# Patient Record
Sex: Male | Born: 1975 | Race: White | Hispanic: No | Marital: Single | Smoking: Current every day smoker
Health system: Southern US, Community
[De-identification: ages and names within clinical notes are randomized; demographics above are authoritative.]

---

## 2001-06-02 ENCOUNTER — Emergency Department (HOSPITAL_COMMUNITY): Admission: EM | Admit: 2001-06-02 | Discharge: 2001-06-02 | Payer: Self-pay | Admitting: Emergency Medicine

## 2002-09-02 ENCOUNTER — Emergency Department (HOSPITAL_COMMUNITY): Admission: EM | Admit: 2002-09-02 | Discharge: 2002-09-02 | Payer: Self-pay | Admitting: Emergency Medicine

## 2002-09-02 ENCOUNTER — Encounter: Payer: Self-pay | Admitting: Emergency Medicine

## 2004-04-20 ENCOUNTER — Emergency Department (HOSPITAL_COMMUNITY): Admission: EM | Admit: 2004-04-20 | Discharge: 2004-04-20 | Payer: Self-pay | Admitting: Emergency Medicine

## 2004-05-31 ENCOUNTER — Emergency Department (HOSPITAL_COMMUNITY): Admission: EM | Admit: 2004-05-31 | Discharge: 2004-05-31 | Payer: Self-pay | Admitting: Emergency Medicine

## 2004-06-18 ENCOUNTER — Emergency Department (HOSPITAL_COMMUNITY): Admission: EM | Admit: 2004-06-18 | Discharge: 2004-06-18 | Payer: Self-pay | Admitting: Emergency Medicine

## 2004-07-16 ENCOUNTER — Emergency Department (HOSPITAL_COMMUNITY): Admission: EM | Admit: 2004-07-16 | Discharge: 2004-07-16 | Payer: Self-pay | Admitting: Emergency Medicine

## 2004-10-24 ENCOUNTER — Ambulatory Visit: Payer: Self-pay | Admitting: Internal Medicine

## 2005-01-30 ENCOUNTER — Emergency Department (HOSPITAL_COMMUNITY): Admission: EM | Admit: 2005-01-30 | Discharge: 2005-01-30 | Payer: Self-pay | Admitting: Emergency Medicine

## 2005-06-18 ENCOUNTER — Emergency Department (HOSPITAL_COMMUNITY): Admission: EM | Admit: 2005-06-18 | Discharge: 2005-06-18 | Payer: Self-pay | Admitting: Emergency Medicine

## 2005-08-07 ENCOUNTER — Inpatient Hospital Stay (HOSPITAL_COMMUNITY): Admission: RE | Admit: 2005-08-07 | Discharge: 2005-08-13 | Payer: Self-pay | Admitting: Psychiatry

## 2005-08-07 ENCOUNTER — Ambulatory Visit: Payer: Self-pay | Admitting: Psychiatry

## 2018-01-09 NOTE — Congregational Nurse Program (Signed)
Congregational Nurse Program Note  Date of Encounter: 12/18/2017  Past Medical History: No past medical history on file.  Encounter Details: CNP Questionnaire - 12/18/17 1529      Questionnaire   Patient Status  Not Applicable    Race  White or Caucasian    Location Patient S[facility]  [facility]    Insurance  Not Applicable    Uninsured  Uninsured (NEW 1x/quarter)    Food  Yes, have food insecurities;Within past 12 months, worried food would run out with no money to buy more    Housing/Utilities  Yes, have permanent housing    Transportation  Yes, need transportation assistance    Interpersonal Safety  No, do not feel physically and emotionally safe where you currently live    Medication  Yes, have medication insecurities    Medical Provider  No    Referrals  Behavioral/Mental Health Provider    ED Visit Averted  Not Applicable    Life-Saving Intervention Made  Not Applicable     [facility].  Requested to see the nurse.  States is having difficulty with paranoia and fearfulness.  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  Believes the symptoms are coming from the Meth use.  States when he is not using, his mind is clear.  At the time of this visit, no evidence of hallucinations or delusions.  Affect appropriate.  Well oriented.  Discussed with client treatment options.  Encouraged him to process the gains in continuing his use versus treatment.  Client states he does not want to quit using Meth.  However he did state, he believes he needs to.

## 2018-01-09 NOTE — Congregational Nurse Program (Signed)
Congregational Nurse Program Note  Date of Encounter: 12/25/2017  Past Medical History: No past medical history on file.  Encounter Details: CNP Questionnaire - 12/25/17 1535      Questionnaire   Patient Status  Not Applicable    Race  White or Caucasian    Location Patient Served At  [facility]  Not Applicable    Uninsured  Uninsured (Subsequent visits/quarter)    Food  Yes, have food insecurities;Within past 12 months, worried food would run out with no money to buy more    Housing/Utilities  Yes, have permanent housing    Transportation  Yes, need transportation assistance    Interpersonal Safety  No, do not feel physically and emotionally safe where you currently live    Medication  Yes, have medication insecurities    Medical Provider  No    Referrals  Behavioral/Mental Health Provider    ED Visit Averted  Not Applicable    Life-Saving Intervention Made  Not Applicable      [facility].  Met with social work Theatre manager to discuss couple's therapy as his Meth use is impacting his relationship.

## 2018-06-24 ENCOUNTER — Emergency Department (HOSPITAL_COMMUNITY)
Admission: EM | Admit: 2018-06-24 | Discharge: 2018-06-24 | Payer: Self-pay | Attending: Emergency Medicine | Admitting: Emergency Medicine

## 2018-06-24 ENCOUNTER — Encounter (HOSPITAL_COMMUNITY): Payer: Self-pay

## 2018-06-24 DIAGNOSIS — L0390 Cellulitis, unspecified: Secondary | ICD-10-CM

## 2018-06-24 DIAGNOSIS — F1910 Other psychoactive substance abuse, uncomplicated: Secondary | ICD-10-CM | POA: Insufficient documentation

## 2018-06-24 DIAGNOSIS — W57XXXA Bitten or stung by nonvenomous insect and other nonvenomous arthropods, initial encounter: Secondary | ICD-10-CM | POA: Insufficient documentation

## 2018-06-24 DIAGNOSIS — F17210 Nicotine dependence, cigarettes, uncomplicated: Secondary | ICD-10-CM | POA: Insufficient documentation

## 2018-06-24 DIAGNOSIS — L03113 Cellulitis of right upper limb: Secondary | ICD-10-CM | POA: Insufficient documentation

## 2018-06-24 MED ORDER — CLINDAMYCIN HCL 300 MG PO CAPS
300.0000 mg | ORAL_CAPSULE | Freq: Once | ORAL | Status: AC
  Administered 2018-06-24: 300 mg via ORAL
  Filled 2018-06-24: qty 1

## 2018-06-24 MED ORDER — CLINDAMYCIN HCL 300 MG PO CAPS: 300.0000 mg | ORAL_CAPSULE | Freq: Four times a day (QID) | ORAL | 0 refills | Status: DC

## 2018-06-24 MED ORDER — ACETAMINOPHEN 500 MG PO TABS
1000.0000 mg | ORAL_TABLET | Freq: Once | ORAL | Status: AC
Start: 2018-06-24 — End: 2018-06-24
  Administered 2018-06-24: 1000 mg via ORAL
  Filled 2018-06-24: qty 2
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
## 2018-06-24 NOTE — ED Notes (Signed)
Pt is clear for discharge when he wakes up and eats his sandwich, [employer] are aware

## 2018-06-24 NOTE — Discharge Instructions (Signed)
Clindamycin as prescribed.  Return to the emergency department for high fever, worsening redness, or other new and concerning symptoms.
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
## 2018-06-24 NOTE — ED Notes (Signed)
Pt given a sandwich and a drink
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
## 2019-02-06 ENCOUNTER — Emergency Department (HOSPITAL_COMMUNITY)
Admission: EM | Admit: 2019-02-06 | Discharge: 2019-02-06 | Disposition: A | Discharge status: Home / Self Care | Payer: Self-pay | Attending: Emergency Medicine | Admitting: Emergency Medicine

## 2019-02-06 ENCOUNTER — Encounter (HOSPITAL_COMMUNITY): Payer: Self-pay | Admitting: Emergency Medicine

## 2019-02-06 DIAGNOSIS — W2209XA Striking against other stationary object, initial encounter: Secondary | ICD-10-CM | POA: Insufficient documentation

## 2019-02-06 DIAGNOSIS — W109XXA Fall (on) (from) unspecified stairs and steps, initial encounter: Secondary | ICD-10-CM | POA: Insufficient documentation

## 2019-02-06 DIAGNOSIS — Y92009 Unspecified place in unspecified non-institutional (private) residence as the place of occurrence of the external cause: Secondary | ICD-10-CM | POA: Insufficient documentation

## 2019-02-06 DIAGNOSIS — S51811A Laceration without foreign body of right forearm, initial encounter: Secondary | ICD-10-CM | POA: Insufficient documentation

## 2019-02-06 DIAGNOSIS — F17210 Nicotine dependence, cigarettes, uncomplicated: Secondary | ICD-10-CM | POA: Insufficient documentation

## 2019-02-06 DIAGNOSIS — Y9389 Activity, other specified: Secondary | ICD-10-CM | POA: Insufficient documentation

## 2019-02-06 DIAGNOSIS — Y999 Unspecified external cause status: Secondary | ICD-10-CM | POA: Insufficient documentation

## 2019-02-06 MED ORDER — LIDOCAINE-EPINEPHRINE (PF) 2 %-1:200000 IJ SOLN
INTRAMUSCULAR | Status: AC
  Filled 2019-02-06: qty 20
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
## 2019-02-06 NOTE — ED Provider Notes (Signed)
[name] EMERGENCY DEPARTMENT Provider Note   CSN: [id number] Arrival date & time: 02/06/19  0306    History   Chief Complaint Chief Complaint  Patient presents with  . Extremity Laceration    HPI [facility] is a 43 y.o. male.     HPI   43yo male presents with right forearm laceration. Reports he was trying to catch himself from fallig and a nail caught and caused laceration to right forearm. Significant bleeding at the scene. Bleeding controlled with dressing.  Has fingers in flexion, reports difficult moving/extending fingers.  UTD tetanus, has had in last 3 years. No numbness. No other injuries. Reports severe pain.   History reviewed. No pertinent past medical history.  There are no active problems to display for this patient.   History reviewed. No pertinent surgical history.      Home Medications    Prior to Admission medications   Medication Sig Start Date End Date Taking? Authorizing Provider  clindamycin (CLEOCIN) 300 MG capsule Take 1 capsule (300 mg total) by mouth 4 (four) times daily. X 10 days 06/24/18   Geoffery Lyons, MD    Family History No family history on file.  Social History Social History   Tobacco Use  . Smoking status: Current Every Day Smoker  . Smokeless tobacco: Never Used  Substance Use Topics  . Alcohol use: Never    Frequency: Never  . Drug use: Yes     Allergies   Patient has no allergy information on record.   Review of Systems Review of Systems  Musculoskeletal: Positive for myalgias.  Skin: Positive for wound.  Neurological: Negative for light-headedness and numbness.     Physical Exam Updated Vital Signs BP 120/68   Pulse 77   Ht 5\' 6"  (1.676 m)   Wt 79.4 kg   SpO2 99%   BMI 28.25 kg/m   Physical Exam Vitals signs and nursing note reviewed.  Constitutional:      General: He is not in acute distress.    Appearance: He is well-developed. He is not diaphoretic.  HENT:     Head:  Normocephalic and atraumatic.  Eyes:     Conjunctiva/sclera: Conjunctivae normal.  Neck:     Musculoskeletal: Normal range of motion.  Cardiovascular:     Rate and Rhythm: Normal rate and regular rhythm.  Pulmonary:     Effort: Pulmonary effort is normal. No respiratory distress.  Musculoskeletal:     Comments: Initial exam: Normal sensation, normal cap refill, 2+ pulses, holding fingers in flexion stating he cannot extend them, holding them with good strength, able to extend Repeat exam: normal full flexion and extension of fingers  Skin:    General: Skin is warm and dry.     Findings: Laceration present.     Comments: 5cm laceration with exposure of fascia/muscle, monitored through finger movements without signs of muscular or tendon injury. Venous bleeding from ulnar side  Neurological:     Mental Status: He is alert and oriented to person, place, and time.      ED Treatments / Results  Labs (all labs ordered are listed, but only abnormal results are displayed) Labs Reviewed - No data to display  EKG None  Radiology No results found.  Procedures .Marland KitchenLaceration Repair Date/Time: 02/06/2019 5:30 AM Performed by: Alvira Monday, MD Authorized by: Alvira Monday, MD   Consent:    Consent obtained:  Verbal   Consent given by:  Patient   Risks discussed:  Infection,  pain, need for additional repair, poor cosmetic result and poor wound healing Anesthesia (see MAR for exact dosages):    Anesthesia method:  Local infiltration   Local anesthetic:  Lidocaine 1% WITH epi Laceration details:    Location:  Shoulder/arm   Shoulder/arm location:  R lower arm   Length (cm):  5   Depth (mm):  7.5 Repair type:    Repair type:  Complex Pre-procedure details:    Preparation:  Patient was prepped and draped in usual sterile fashion Exploration:    Hemostasis achieved with:  Direct pressure   Wound exploration: wound explored through full range of motion and entire depth of wound  probed and visualized     Wound extent: no muscle damage noted, no nerve damage noted and no tendon damage noted   Treatment:    Area cleansed with:  Saline   Amount of cleaning:  Standard   Irrigation solution:  Sterile saline   Irrigation volume:  450   Irrigation method:  Pressure wash   Visualized foreign bodies/material removed: no   Subcutaneous repair:    Suture size:  3-0   Suture material:  Vicryl   Suture technique:  Simple interrupted   Number of sutures:  4 Skin repair:    Repair method:  Sutures   Suture size:  3-0   Suture material:  Prolene   Suture technique:  Simple interrupted   Number of sutures:  6 Approximation:    Approximation:  Close Post-procedure details:    Dressing:  Antibiotic ointment and non-adherent dressing   Patient tolerance of procedure:  Tolerated well, no immediate complications   (including critical care time)  Medications Ordered in ED Medications  lidocaine-EPINEPHrine (XYLOCAINE W/EPI) 2 %-1:200000 (PF) injection (has no administration in time range)     Initial Impression / Assessment and Plan / ED Course  I have reviewed the triage vital signs and the nursing notes.  Pertinent labs & imaging results that were available during my care of the patient were reviewed by me and considered in my medical decision making (see chart for details).        43yo RHD male presents with concern fro right forearm laceration after accidentally cutting self on a nail.  No hx to suggest other injuries or fracture. Wound explored thoroughly with no sign of foreign body.  Venous bleeding controlled with pressure and licocaine/epi.  Initially reporting difficulty extending fingers, although the laceration is located on volar forearm and not consistent with extensor tendon injury. No sign of flexor tendon injury on history or exam.  Initial difficulty extending fingers likely secondary to pain. After local anesthesia given, demonstrates ful ROM of  fingers.    Recommend suture removal in 7-10 days, discussed reasons to return. Patient discharged in stable condition with understanding of reasons to return.   Final Clinical Impressions(s) / ED Diagnoses   Final diagnoses:  Laceration of right forearm, initial encounter    ED Discharge Orders    None       Alvira Monday, MD 02/06/19 2257

## 2020-03-26 ENCOUNTER — Emergency Department (HOSPITAL_COMMUNITY): Payer: Self-pay

## 2020-03-26 ENCOUNTER — Other Ambulatory Visit: Payer: Self-pay

## 2020-03-26 ENCOUNTER — Encounter (HOSPITAL_COMMUNITY): Payer: Self-pay | Admitting: Emergency Medicine

## 2020-03-26 ENCOUNTER — Emergency Department (HOSPITAL_COMMUNITY)
Admission: EM | Admit: 2020-03-26 | Discharge: 2020-03-26 | Disposition: A | Discharge status: Home / Self Care | Payer: Self-pay | Attending: Emergency Medicine | Admitting: Emergency Medicine

## 2020-03-26 DIAGNOSIS — T148XXA Other injury of unspecified body region, initial encounter: Secondary | ICD-10-CM

## 2020-03-26 DIAGNOSIS — F17210 Nicotine dependence, cigarettes, uncomplicated: Secondary | ICD-10-CM | POA: Insufficient documentation

## 2020-03-26 DIAGNOSIS — Y929 Unspecified place or not applicable: Secondary | ICD-10-CM | POA: Insufficient documentation

## 2020-03-26 DIAGNOSIS — L03115 Cellulitis of right lower limb: Secondary | ICD-10-CM | POA: Insufficient documentation

## 2020-03-26 DIAGNOSIS — S21232A Puncture wound without foreign body of left back wall of thorax without penetration into thoracic cavity, initial encounter: Secondary | ICD-10-CM | POA: Insufficient documentation

## 2020-03-26 DIAGNOSIS — Y9389 Activity, other specified: Secondary | ICD-10-CM | POA: Insufficient documentation

## 2020-03-26 DIAGNOSIS — Y999 Unspecified external cause status: Secondary | ICD-10-CM | POA: Insufficient documentation

## 2020-03-26 LAB — COMPREHENSIVE METABOLIC PANEL
ALT: 43 U/L (ref 0–44)
AST: 63 U/L — ABNORMAL HIGH (ref 15–41)
Albumin: 4.4 g/dL (ref 3.5–5.0)
Alkaline Phosphatase: 69 U/L (ref 38–126)
Anion gap: 9 (ref 5–15)
BUN: 10 mg/dL (ref 6–20)
CO2: 23 mmol/L (ref 22–32)
Calcium: 9.6 mg/dL (ref 8.9–10.3)
Chloride: 109 mmol/L (ref 98–111)
Creatinine, Ser: 0.98 mg/dL (ref 0.61–1.24)
GFR calc Af Amer: >60 mL/min (ref >60)
GFR calc non Af Amer: >60 mL/min (ref >60)
Glucose, Bld: 101 mg/dL — ABNORMAL HIGH (ref 70–99)
Potassium: 3.6 mmol/L (ref 3.5–5.1)
Sodium: 141 mmol/L (ref 135–145)
Total Bilirubin: 1 mg/dL (ref 0.3–1.2)
Total Protein: 7.2 g/dL (ref 6.5–8.1)

## 2020-03-26 LAB — CBC
HCT: 40.2 % (ref 39.0–52.0)
Hemoglobin: 13.6 g/dL (ref 13.0–17.0)
MCH: 31 pg (ref 26.0–34.0)
MCHC: 33.8 g/dL (ref 30.0–36.0)
MCV: 91.6 fL (ref 80.0–100.0)
Platelets: 276 10*3/uL (ref 150–400)
RBC: 4.39 MIL/uL (ref 4.22–5.81)
RDW: 13.2 % (ref 11.5–15.5)
WBC: 9.1 10*3/uL (ref 4.0–10.5)
nRBC: 0 % (ref 0.0–0.2)

## 2020-03-26 LAB — SAMPLE TO BLOOD BANK

## 2020-03-26 LAB — I-STAT CHEM 8, ED
BUN: 11 mg/dL (ref 6–20)
Calcium, Ion: 1.2 mmol/L (ref 1.15–1.40)
Chloride: 106 mmol/L (ref 98–111)
Creatinine, Ser: 0.8 mg/dL (ref 0.61–1.24)
Glucose, Bld: 96 mg/dL (ref 70–99)
HCT: 40 % (ref 39.0–52.0)
Hemoglobin: 13.6 g/dL (ref 13.0–17.0)
Potassium: 3.5 mmol/L (ref 3.5–5.1)
Sodium: 143 mmol/L (ref 135–145)
TCO2: 24 mmol/L (ref 22–32)

## 2020-03-26 LAB — LACTIC ACID, PLASMA: Lactic Acid, Venous: 1.6 mmol/L (ref 0.5–1.9)

## 2020-03-26 LAB — ETHANOL: Alcohol, Ethyl (B): <10 mg/dL (ref <10)

## 2020-03-26 LAB — PROTIME-INR
INR: 1.1 (ref 0.8–1.2)
Prothrombin Time: 13.7 seconds (ref 11.4–15.2)

## 2020-03-26 MED ORDER — MORPHINE SULFATE (PF) 4 MG/ML IV SOLN: 4.0000 mg | Freq: Once | INTRAVENOUS | Status: DC

## 2020-03-26 MED ORDER — ONDANSETRON HCL 4 MG/2ML IJ SOLN: 4.0000 mg | Freq: Once | INTRAMUSCULAR | Status: DC

## 2020-03-26 MED ORDER — IOHEXOL 300 MG/ML  SOLN
100.0000 mL | Freq: Once | INTRAMUSCULAR | Status: AC | PRN
  Administered 2020-03-26: 16:00:00 100 mL via INTRAVENOUS
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
## 2020-03-26 NOTE — Discharge Instructions (Signed)
Please clean this out as best you can in the shower.
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
## 2020-03-26 NOTE — ED Notes (Signed)
Pt talking to self saying "I'm not a drug addict and I don't appreciate people treating me like one.  I didn't ask for pain meds".

## 2020-04-28 ENCOUNTER — Encounter (HOSPITAL_COMMUNITY): Payer: Self-pay

## 2020-04-28 ENCOUNTER — Emergency Department (HOSPITAL_COMMUNITY)
Admission: EM | Admit: 2020-04-28 | Discharge: 2020-04-28 | Disposition: A | Discharge status: Home / Self Care | Payer: Self-pay | Attending: Emergency Medicine | Admitting: Emergency Medicine

## 2020-04-28 ENCOUNTER — Emergency Department (HOSPITAL_COMMUNITY): Payer: Self-pay

## 2020-04-28 DIAGNOSIS — Y939 Activity, unspecified: Secondary | ICD-10-CM | POA: Insufficient documentation

## 2020-04-28 DIAGNOSIS — F17200 Nicotine dependence, unspecified, uncomplicated: Secondary | ICD-10-CM | POA: Insufficient documentation

## 2020-04-28 DIAGNOSIS — R609 Edema, unspecified: Secondary | ICD-10-CM

## 2020-04-28 DIAGNOSIS — S62611A Displaced fracture of proximal phalanx of left index finger, initial encounter for closed fracture: Secondary | ICD-10-CM | POA: Insufficient documentation

## 2020-04-28 DIAGNOSIS — W500XXA Accidental hit or strike by another person, initial encounter: Secondary | ICD-10-CM | POA: Insufficient documentation

## 2020-04-28 DIAGNOSIS — Y929 Unspecified place or not applicable: Secondary | ICD-10-CM | POA: Insufficient documentation

## 2020-04-28 DIAGNOSIS — Y999 Unspecified external cause status: Secondary | ICD-10-CM | POA: Insufficient documentation

## 2020-04-28 MED ORDER — OXYCODONE-ACETAMINOPHEN 5-325 MG PO TABS
1.0000 | ORAL_TABLET | Freq: Once | ORAL | Status: AC
  Administered 2020-04-28: 1 via ORAL
  Filled 2020-04-28: qty 1

## 2020-04-28 MED ORDER — HYDROCODONE-ACETAMINOPHEN 5-325 MG PO TABS: 1.0000 | ORAL_TABLET | Freq: Four times a day (QID) | ORAL | 0 refills | Status: DC | PRN
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
## 2020-04-28 NOTE — Discharge Instructions (Addendum)
Please read and follow all provided instructions.  You have been seen today after an injury to your left hand Your xray shows a fracture of your second finger We have placed you in a splint- please keep this clean & dry and intact until you have followed up with orthopedics. Please call orthopedic surgery tomorrow to schedule an appointment within the next 3 days.   Home care instructions: -- *PRICE in the first 24-48 hours after injury: Protect with splint Rest Ice- Do not apply ice pack directly to your splint place towel or similar between your splint and ice/ice pack. Apply ice for 20 min, then remove for 40 min while awake Compression- splint Elevate affected extremity above the level of your heart when not walking around for the first 24-48 hours   Medications:  Please take ibuprofen per over the counter dosing to help with pain/swelling.  If your pain is not alleviated by ibuprofen please take percocet.  -Norco-this is a narcotic/controlled substance medication that has potential addicting qualities.  We recommend that you take 1-2 tablets every 6 hours as needed for severe pain.  Do not drive or operate heavy machinery when taking this medicine as it can be sedating. Do not drink alcohol or take other sedating medications when taking this medicine for safety reasons.  Keep this out of reach of small children.  Please be aware this medicine has Tylenol in it (325 mg/tab) do not exceed the maximum dose of Tylenol in a day per over the counter recommendations should you decide to supplement with Tylenol over the counter.   We have prescribed you new medication(s) today. Discuss the medications prescribed today with your pharmacist as they can have adverse effects and interactions with your other medicines including over the counter and prescribed medications. Seek medical evaluation if you start to experience new or abnormal symptoms after taking one of these medicines, seek care immediately  if you start to experience difficulty breathing, feeling of your throat closing, facial swelling, or rash as these could be indications of a more serious allergic reaction   Follow-up instructions: Please follow-up with the orthopedic surgeon in your discharge instructions.   Return instructions:  Please return if your digits or extremity are numb or tingling, appear gray or blue, or you have severe pain (also elevate the extremity and loosen splint or wrap if you were given one) Please return if you have redness or fevers.  Please return to the Emergency Department if you experience worsening symptoms.  Please return if you have any other emergent concerns. Additional Information:  Your vital signs today were: BP 132/89   Pulse 63   Temp 97.9 F (36.6 C)   Resp 16   SpO2 100%  If your blood pressure (BP) was elevated above 135/85 this visit, please have this repeated by your doctor within one month. ---------------
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
## 2020-08-11 ENCOUNTER — Encounter (HOSPITAL_COMMUNITY): Payer: Self-pay | Admitting: Emergency Medicine

## 2020-08-11 ENCOUNTER — Other Ambulatory Visit: Payer: Self-pay

## 2020-08-11 ENCOUNTER — Emergency Department (HOSPITAL_COMMUNITY): Payer: Self-pay

## 2020-08-11 ENCOUNTER — Inpatient Hospital Stay (HOSPITAL_COMMUNITY): Payer: Self-pay | Admitting: Certified Registered"

## 2020-08-11 ENCOUNTER — Inpatient Hospital Stay (HOSPITAL_COMMUNITY)
Admission: EM | Admit: 2020-08-11 | Discharge: 2020-08-16 | DRG: 513 | Disposition: A | Discharge status: Home Health | Payer: Self-pay | Attending: Internal Medicine | Admitting: Internal Medicine

## 2020-08-11 ENCOUNTER — Encounter (HOSPITAL_COMMUNITY)
Admission: EM | Disposition: A | Discharge status: Home Health | Payer: Self-pay | Source: Home / Self Care | Attending: Internal Medicine

## 2020-08-11 DIAGNOSIS — W19XXXA Unspecified fall, initial encounter: Secondary | ICD-10-CM | POA: Diagnosis present

## 2020-08-11 DIAGNOSIS — L03012 Cellulitis of left finger: Secondary | ICD-10-CM | POA: Diagnosis present

## 2020-08-11 DIAGNOSIS — F1210 Cannabis abuse, uncomplicated: Secondary | ICD-10-CM | POA: Diagnosis present

## 2020-08-11 DIAGNOSIS — S61032A Puncture wound without foreign body of left thumb without damage to nail, initial encounter: Secondary | ICD-10-CM | POA: Diagnosis present

## 2020-08-11 DIAGNOSIS — L0390 Cellulitis, unspecified: Secondary | ICD-10-CM | POA: Diagnosis present

## 2020-08-11 DIAGNOSIS — Z23 Encounter for immunization: Secondary | ICD-10-CM

## 2020-08-11 DIAGNOSIS — U071 COVID-19: Secondary | ICD-10-CM | POA: Diagnosis present

## 2020-08-11 DIAGNOSIS — F17210 Nicotine dependence, cigarettes, uncomplicated: Secondary | ICD-10-CM | POA: Diagnosis present

## 2020-08-11 DIAGNOSIS — E876 Hypokalemia: Secondary | ICD-10-CM | POA: Diagnosis present

## 2020-08-11 DIAGNOSIS — S62611A Displaced fracture of proximal phalanx of left index finger, initial encounter for closed fracture: Secondary | ICD-10-CM | POA: Diagnosis present

## 2020-08-11 DIAGNOSIS — M86142 Other acute osteomyelitis, left hand: Principal | ICD-10-CM | POA: Diagnosis present

## 2020-08-11 DIAGNOSIS — M009 Pyogenic arthritis, unspecified: Secondary | ICD-10-CM | POA: Diagnosis present

## 2020-08-11 DIAGNOSIS — R0602 Shortness of breath: Secondary | ICD-10-CM

## 2020-08-11 DIAGNOSIS — B9562 Methicillin resistant Staphylococcus aureus infection as the cause of diseases classified elsewhere: Secondary | ICD-10-CM | POA: Diagnosis present

## 2020-08-11 DIAGNOSIS — M65142 Other infective (teno)synovitis, left hand: Secondary | ICD-10-CM | POA: Diagnosis present

## 2020-08-11 DIAGNOSIS — F17200 Nicotine dependence, unspecified, uncomplicated: Secondary | ICD-10-CM | POA: Diagnosis present

## 2020-08-11 DIAGNOSIS — M869 Osteomyelitis, unspecified: Secondary | ICD-10-CM | POA: Diagnosis present

## 2020-08-11 DIAGNOSIS — L02512 Cutaneous abscess of left hand: Secondary | ICD-10-CM | POA: Diagnosis present

## 2020-08-11 HISTORY — PX: I & D EXTREMITY: SHX5045

## 2020-08-11 LAB — URINALYSIS, ROUTINE W REFLEX MICROSCOPIC
Bilirubin Urine: NEGATIVE
Glucose, UA: NEGATIVE mg/dL
Hgb urine dipstick: NEGATIVE
Ketones, ur: NEGATIVE mg/dL
Leukocytes,Ua: NEGATIVE
Nitrite: NEGATIVE
Protein, ur: NEGATIVE mg/dL
Specific Gravity, Urine: 1.027 (ref 1.005–1.030)
pH: 5 (ref 5.0–8.0)

## 2020-08-11 LAB — CBC WITH DIFFERENTIAL/PLATELET
Abs Immature Granulocytes: 0.02 10*3/uL (ref 0.00–0.07)
Basophils Absolute: 0 10*3/uL (ref 0.0–0.1)
Basophils Relative: 0 %
Eosinophils Absolute: 0.4 10*3/uL (ref 0.0–0.5)
Eosinophils Relative: 5 %
HCT: 40.2 % (ref 39.0–52.0)
Hemoglobin: 13.3 g/dL (ref 13.0–17.0)
Immature Granulocytes: 0 %
Lymphocytes Relative: 14 %
Lymphs Abs: 1.1 10*3/uL (ref 0.7–4.0)
MCH: 29.2 pg (ref 26.0–34.0)
MCHC: 33.1 g/dL (ref 30.0–36.0)
MCV: 88.2 fL (ref 80.0–100.0)
Monocytes Absolute: 0.5 10*3/uL (ref 0.1–1.0)
Monocytes Relative: 7 %
Neutro Abs: 5.7 10*3/uL (ref 1.7–7.7)
Neutrophils Relative %: 74 %
Platelets: 286 10*3/uL (ref 150–400)
RBC: 4.56 MIL/uL (ref 4.22–5.81)
RDW: 12.4 % (ref 11.5–15.5)
WBC: 7.8 10*3/uL (ref 4.0–10.5)
nRBC: 0 % (ref 0.0–0.2)

## 2020-08-11 LAB — COMPREHENSIVE METABOLIC PANEL
ALT: 20 U/L (ref 0–44)
AST: 22 U/L (ref 15–41)
Albumin: 3.7 g/dL (ref 3.5–5.0)
Alkaline Phosphatase: 80 U/L (ref 38–126)
Anion gap: 12 (ref 5–15)
BUN: 10 mg/dL (ref 6–20)
CO2: 23 mmol/L (ref 22–32)
Calcium: 8.8 mg/dL — ABNORMAL LOW (ref 8.9–10.3)
Chloride: 103 mmol/L (ref 98–111)
Creatinine, Ser: 0.9 mg/dL (ref 0.61–1.24)
GFR calc Af Amer: >60 mL/min (ref >60)
GFR calc non Af Amer: >60 mL/min (ref >60)
Glucose, Bld: 127 mg/dL — ABNORMAL HIGH (ref 70–99)
Potassium: 3.4 mmol/L — ABNORMAL LOW (ref 3.5–5.1)
Sodium: 138 mmol/L (ref 135–145)
Total Bilirubin: 0.3 mg/dL (ref 0.3–1.2)
Total Protein: 7.2 g/dL (ref 6.5–8.1)

## 2020-08-11 LAB — SARS CORONAVIRUS 2 BY RT PCR (HOSPITAL ORDER, PERFORMED IN ~~LOC~~ HOSPITAL LAB): SARS Coronavirus 2: POSITIVE — AB

## 2020-08-11 LAB — LACTIC ACID, PLASMA: Lactic Acid, Venous: 1.8 mmol/L (ref 0.5–1.9)

## 2020-08-11 SURGERY — IRRIGATION AND DEBRIDEMENT EXTREMITY
Anesthesia: General | Laterality: Left

## 2020-08-11 MED ORDER — ASCORBIC ACID 500 MG PO TABS
1000.0000 mg | ORAL_TABLET | Freq: Every day | ORAL | Status: DC
  Administered 2020-08-11 – 2020-08-16 (×5): 1000 mg via ORAL
  Filled 2020-08-11 (×7): qty 2

## 2020-08-11 MED ORDER — HYDROMORPHONE HCL 1 MG/ML IJ SOLN
0.5000 mg | INTRAMUSCULAR | Status: DC | PRN
  Administered 2020-08-11: 0.5 mg via INTRAVENOUS
  Filled 2020-08-11: qty 1

## 2020-08-11 MED ORDER — VANCOMYCIN HCL IN DEXTROSE 1-5 GM/200ML-% IV SOLN
1000.0000 mg | Freq: Three times a day (TID) | INTRAVENOUS | Status: DC
  Administered 2020-08-11 – 2020-08-13 (×6): 1000 mg via INTRAVENOUS
  Filled 2020-08-11 (×7): qty 200

## 2020-08-11 MED ORDER — FENTANYL CITRATE (PF) 250 MCG/5ML IJ SOLN
INTRAMUSCULAR | Status: AC
  Filled 2020-08-11: qty 5

## 2020-08-11 MED ORDER — SODIUM CHLORIDE 0.9 % IV SOLN: INTRAVENOUS | Status: DC

## 2020-08-11 MED ORDER — DIPHENHYDRAMINE HCL 50 MG/ML IJ SOLN
INTRAMUSCULAR | Status: DC | PRN
  Administered 2020-08-11: 12.5 mg via INTRAVENOUS

## 2020-08-11 MED ORDER — VANCOMYCIN HCL 1500 MG/300ML IV SOLN
1500.0000 mg | Freq: Once | INTRAVENOUS | Status: AC
  Administered 2020-08-11: 1500 mg via INTRAVENOUS
  Filled 2020-08-11: qty 300

## 2020-08-11 MED ORDER — LIDOCAINE 2% (20 MG/ML) 5 ML SYRINGE
INTRAMUSCULAR | Status: DC | PRN
  Administered 2020-08-11: 60 mg via INTRAVENOUS

## 2020-08-11 MED ORDER — KETOROLAC TROMETHAMINE 30 MG/ML IJ SOLN
INTRAMUSCULAR | Status: AC
  Administered 2020-08-11: 30 mg
  Filled 2020-08-11: qty 1

## 2020-08-11 MED ORDER — CLINDAMYCIN PHOSPHATE 600 MG/50ML IV SOLN
600.0000 mg | Freq: Once | INTRAVENOUS | Status: DC
  Filled 2020-08-11: qty 50

## 2020-08-11 MED ORDER — PROPOFOL 10 MG/ML IV BOLUS
INTRAVENOUS | Status: DC | PRN
  Administered 2020-08-11: 150 mg via INTRAVENOUS
  Administered 2020-08-11: 50 mg via INTRAVENOUS

## 2020-08-11 MED ORDER — ROCURONIUM BROMIDE 10 MG/ML (PF) SYRINGE
PREFILLED_SYRINGE | INTRAVENOUS | Status: AC
  Filled 2020-08-11: qty 10

## 2020-08-11 MED ORDER — SODIUM CHLORIDE 0.9 % IR SOLN
Status: DC | PRN
  Administered 2020-08-11: 3000 mL
  Administered 2020-08-11: 1000 mL

## 2020-08-11 MED ORDER — FENTANYL CITRATE (PF) 100 MCG/2ML IJ SOLN
INTRAMUSCULAR | Status: DC | PRN
Start: 2020-08-11 — End: 2020-08-11
  Administered 2020-08-11: 50 ug via INTRAVENOUS
  Administered 2020-08-11: 100 ug via INTRAVENOUS
  Administered 2020-08-11: 50 ug via INTRAVENOUS

## 2020-08-11 MED ORDER — HYDROCODONE-ACETAMINOPHEN 5-325 MG PO TABS
1.0000 | ORAL_TABLET | Freq: Four times a day (QID) | ORAL | Status: DC | PRN
  Administered 2020-08-11: 1 via ORAL
  Administered 2020-08-12: 2 via ORAL
  Administered 2020-08-12: 1 via ORAL
  Administered 2020-08-14 – 2020-08-15 (×2): 2 via ORAL
  Filled 2020-08-11 (×3): qty 2
  Filled 2020-08-11: qty 1
  Filled 2020-08-11: qty 2

## 2020-08-11 MED ORDER — SUCCINYLCHOLINE CHLORIDE 20 MG/ML IJ SOLN
INTRAMUSCULAR | Status: DC | PRN
  Administered 2020-08-11: 120 mg via INTRAVENOUS

## 2020-08-11 MED ORDER — SUCCINYLCHOLINE CHLORIDE 200 MG/10ML IV SOSY
PREFILLED_SYRINGE | INTRAVENOUS | Status: AC
  Filled 2020-08-11: qty 10

## 2020-08-11 MED ORDER — ONDANSETRON HCL 4 MG/2ML IJ SOLN
4.0000 mg | Freq: Four times a day (QID) | INTRAMUSCULAR | Status: DC | PRN
  Administered 2020-08-11 – 2020-08-15 (×2): 4 mg via INTRAVENOUS
  Filled 2020-08-11: qty 2

## 2020-08-11 MED ORDER — METHOCARBAMOL 500 MG PO TABS
500.0000 mg | ORAL_TABLET | Freq: Four times a day (QID) | ORAL | Status: DC | PRN
  Administered 2020-08-11 – 2020-08-14 (×2): 500 mg via ORAL
  Filled 2020-08-11 (×2): qty 1

## 2020-08-11 MED ORDER — LIDOCAINE 2% (20 MG/ML) 5 ML SYRINGE
INTRAMUSCULAR | Status: AC
  Filled 2020-08-11: qty 5

## 2020-08-11 MED ORDER — MIDAZOLAM HCL 2 MG/2ML IJ SOLN
INTRAMUSCULAR | Status: DC | PRN
  Administered 2020-08-11: 2 mg via INTRAVENOUS

## 2020-08-11 MED ORDER — SODIUM CHLORIDE 0.9 % IV SOLN
2.0000 g | Freq: Three times a day (TID) | INTRAVENOUS | Status: DC
  Administered 2020-08-11 – 2020-08-13 (×6): 2 g via INTRAVENOUS
  Filled 2020-08-11 (×7): qty 2

## 2020-08-11 MED ORDER — PROPOFOL 10 MG/ML IV BOLUS
INTRAVENOUS | Status: AC
  Filled 2020-08-11: qty 20

## 2020-08-11 MED ORDER — POTASSIUM CHLORIDE 10 MEQ/100ML IV SOLN
10.0000 meq | INTRAVENOUS | Status: AC
  Administered 2020-08-11: 10 meq via INTRAVENOUS
  Filled 2020-08-11: qty 100

## 2020-08-11 MED ORDER — ONDANSETRON HCL 4 MG PO TABS: 4.0000 mg | ORAL_TABLET | Freq: Four times a day (QID) | ORAL | Status: DC | PRN

## 2020-08-11 MED ORDER — SODIUM CHLORIDE 0.9 % IV SOLN
2.0000 g | Freq: Once | INTRAVENOUS | Status: AC
  Administered 2020-08-11: 2 g via INTRAVENOUS
  Filled 2020-08-11: qty 2

## 2020-08-11 MED ORDER — SODIUM CHLORIDE 0.9 % IV SOLN: INTRAVENOUS | Status: AC

## 2020-08-11 MED ORDER — HYDROMORPHONE HCL 1 MG/ML IJ SOLN
INTRAMUSCULAR | Status: AC
  Filled 2020-08-11: qty 1

## 2020-08-11 MED ORDER — MIDAZOLAM HCL 2 MG/2ML IJ SOLN
INTRAMUSCULAR | Status: AC
  Filled 2020-08-11: qty 2

## 2020-08-11 MED ORDER — CHLORHEXIDINE GLUCONATE 4 % EX LIQD
60.0000 mL | Freq: Once | CUTANEOUS | Status: DC
  Filled 2020-08-11: qty 60

## 2020-08-11 MED ORDER — LACTATED RINGERS IV SOLN: INTRAVENOUS | Status: DC | PRN

## 2020-08-11 MED ORDER — OXYCODONE HCL 5 MG PO TABS
10.0000 mg | ORAL_TABLET | ORAL | Status: DC | PRN
  Administered 2020-08-15 (×2): 10 mg via ORAL
  Filled 2020-08-11 (×2): qty 2

## 2020-08-11 MED ORDER — NICOTINE 21 MG/24HR TD PT24
21.0000 mg | MEDICATED_PATCH | Freq: Every day | TRANSDERMAL | Status: DC
  Administered 2020-08-11 – 2020-08-16 (×6): 21 mg via TRANSDERMAL
  Filled 2020-08-11 (×6): qty 1

## 2020-08-11 MED ORDER — HYDROMORPHONE HCL 1 MG/ML IJ SOLN
1.0000 mg | INTRAMUSCULAR | Status: DC | PRN
  Administered 2020-08-13 – 2020-08-15 (×10): 1 mg via INTRAVENOUS
  Filled 2020-08-11 (×10): qty 1

## 2020-08-11 SURGICAL SUPPLY — 43 items
BNDG CONFORM 2 STRL LF (GAUZE/BANDAGES/DRESSINGS) ×2 IMPLANT
BNDG ELASTIC 4X5.8 VLCR STR LF (GAUZE/BANDAGES/DRESSINGS) ×3 IMPLANT
BNDG GAUZE ELAST 4 BULKY (GAUZE/BANDAGES/DRESSINGS) ×5 IMPLANT
CORD BIPOLAR FORCEPS 12FT (ELECTRODE) ×3 IMPLANT
COVER SURGICAL LIGHT HANDLE (MISCELLANEOUS) ×3 IMPLANT
COVER WAND RF STERILE (DRAPES) ×3 IMPLANT
CUFF TOURN SGL QUICK 18X4 (TOURNIQUET CUFF) ×3 IMPLANT
CUFF TOURN SGL QUICK 24 (TOURNIQUET CUFF)
CUFF TRNQT CYL 24X4X16.5-23 (TOURNIQUET CUFF) IMPLANT
DRSG ADAPTIC 3X8 NADH LF (GAUZE/BANDAGES/DRESSINGS) ×3 IMPLANT
GAUZE SPONGE 4X4 12PLY STRL (GAUZE/BANDAGES/DRESSINGS) ×3 IMPLANT
GAUZE SPONGE 4X4 12PLY STRL LF (GAUZE/BANDAGES/DRESSINGS) ×2 IMPLANT
GAUZE XEROFORM 1X8 LF (GAUZE/BANDAGES/DRESSINGS) ×3 IMPLANT
GAUZE XEROFORM 5X9 LF (GAUZE/BANDAGES/DRESSINGS) ×2 IMPLANT
GLOVE BIOGEL M 8.0 STRL (GLOVE) ×3 IMPLANT
GLOVE SS BIOGEL STRL SZ 8 (GLOVE) ×1 IMPLANT
GLOVE SUPERSENSE BIOGEL SZ 8 (GLOVE) ×2
GOWN STRL REUS W/ TWL LRG LVL3 (GOWN DISPOSABLE) ×1 IMPLANT
GOWN STRL REUS W/ TWL XL LVL3 (GOWN DISPOSABLE) ×2 IMPLANT
GOWN STRL REUS W/TWL LRG LVL3 (GOWN DISPOSABLE) ×3
GOWN STRL REUS W/TWL XL LVL3 (GOWN DISPOSABLE) ×6
KIT BASIN OR (CUSTOM PROCEDURE TRAY) ×3 IMPLANT
KIT TURNOVER KIT B (KITS) ×3 IMPLANT
MANIFOLD NEPTUNE II (INSTRUMENTS) ×3 IMPLANT
NDL HYPO 25GX1X1/2 BEV (NEEDLE) IMPLANT
NEEDLE HYPO 25GX1X1/2 BEV (NEEDLE) IMPLANT
NS IRRIG 1000ML POUR BTL (IV SOLUTION) ×3 IMPLANT
PACK ORTHO EXTREMITY (CUSTOM PROCEDURE TRAY) ×3 IMPLANT
PAD ARMBOARD 7.5X6 YLW CONV (MISCELLANEOUS) ×3 IMPLANT
PAD CAST 4YDX4 CTTN HI CHSV (CAST SUPPLIES) ×1 IMPLANT
PADDING CAST COTTON 4X4 STRL (CAST SUPPLIES) ×3
SET CYSTO W/LG BORE CLAMP LF (SET/KITS/TRAYS/PACK) ×3 IMPLANT
SOL PREP POV-IOD 4OZ 10% (MISCELLANEOUS) ×6 IMPLANT
SPONGE LAP 4X18 RFD (DISPOSABLE) ×3 IMPLANT
SWAB COLLECTION DEVICE MRSA (MISCELLANEOUS) ×4 IMPLANT
SWAB CULTURE ESWAB REG 1ML (MISCELLANEOUS) ×4 IMPLANT
SYR CONTROL 10ML LL (SYRINGE) IMPLANT
TOWEL GREEN STERILE (TOWEL DISPOSABLE) ×3 IMPLANT
TOWEL GREEN STERILE FF (TOWEL DISPOSABLE) ×3 IMPLANT
TUBE CONNECTING 12'X1/4 (SUCTIONS) ×1
TUBE CONNECTING 12X1/4 (SUCTIONS) ×2 IMPLANT
WATER STERILE IRR 1000ML POUR (IV SOLUTION) ×3 IMPLANT
YANKAUER SUCT BULB TIP NO VENT (SUCTIONS) ×3 IMPLANT

## 2020-08-11 NOTE — Anesthesia Procedure Notes (Signed)
Procedure Name: Intubation Date/Time: 08/11/2020 6:31 PM Performed by: De Nurse, CRNA Pre-anesthesia Checklist: Patient identified, Emergency Drugs available, Suction available and Patient being monitored Patient Re-evaluated:Patient Re-evaluated prior to induction Oxygen Delivery Method: Circle System Utilized Preoxygenation: Pre-oxygenation with 100% oxygen Induction Type: IV induction Ventilation: Mask ventilation without difficulty Laryngoscope Size: Glidescope and 4 Grade View: Grade I Tube type: Oral Tube size: 7.5 mm Number of attempts: 1 Airway Equipment and Method: Stylet and Oral airway Placement Confirmation: ETT inserted through vocal cords under direct vision,  positive ETCO2 and breath sounds checked- equal and bilateral Secured at: 20 cm Tube secured with: Tape Dental Injury: Teeth and Oropharynx as per pre-operative assessment  Comments: Glidescope d/t covid status

## 2020-08-11 NOTE — Anesthesia Postprocedure Evaluation (Signed)
Anesthesia Post Note  Patient: Kenneth Faulkner  Procedure(s) Performed: IRRIGATION AND DEBRIDEMENT OF THUMB (Left )     Patient location during evaluation: PACU Anesthesia Type: General Level of consciousness: awake and alert Pain management: pain level controlled Vital Signs Assessment: post-procedure vital signs reviewed and stable Respiratory status: spontaneous breathing, nonlabored ventilation, respiratory function stable and patient connected to nasal cannula oxygen Cardiovascular status: blood pressure returned to baseline and stable Postop Assessment: no apparent nausea or vomiting Anesthetic complications: no   No complications documented.  Last Vitals:  Vitals:   08/11/20 2144 08/11/20 2149  BP: 134/82   Pulse: (!) 58 64  Resp: 16   Temp: 36.8 C   SpO2: 99% 99%    Last Pain:  Vitals:   08/11/20 2149  TempSrc:   PainSc: 5                  [name]

## 2020-08-11 NOTE — Progress Notes (Signed)
Dilaudid 1mg  iv wasted in sharps in room 5w13 due to covid isolation. Witnessed by RN.

## 2020-08-11 NOTE — H&P (Signed)
  Patient seen and examined.  Patient will undergo irrigation debridement of a significantly infected with possible osteomyelitic focus will obtain cultures and performed a thorough debridement today.  Patient is aware and desires to proceed.  I reviewed his notes and cosigned his consultation report from my PA Earney Hamburg.  We are planning surgery for your upper extremity. The risk and benefits of surgery to include risk of bleeding, infection, anesthesia,  damage to normal structures and failure of the surgery to accomplish its intended goals of relieving symptoms and restoring function have been discussed in detail. With this in mind we plan to proceed. I have specifically discussed with the patient the pre-and postoperative regime and the dos and don'ts and risk and benefits in great detail. Risk and benefits of surgery also include risk of dystrophy(CRPS), chronic nerve pain, failure of the healing process to go onto completion and other inherent risks of surgery The relavent the pathophysiology of the disease/injury process, as well as the alternatives for treatment and postoperative course of action has been discussed in great detail with the patient who desires to proceed.  We will do everything in our power to help you (the patient) restore function to the upper extremity. It is a pleasure to see this patient today.   Jazae Gandolfi MD

## 2020-08-11 NOTE — Plan of Care (Signed)
  Problem: Health Behavior/Discharge Planning: Goal: Ability to manage health-related needs will improve Outcome: Progressing   Problem: Clinical Measurements: Goal: Respiratory complications will improve Outcome: Progressing   Problem: Clinical Measurements: Goal: Diagnostic test results will improve Outcome: Progressing   Problem: Clinical Measurements: Goal: Ability to maintain clinical measurements within normal limits will improve Outcome: Progressing   Problem: Activity: Goal: Risk for activity intolerance will decrease Outcome: Progressing   Problem: Coping: Goal: Level of anxiety will decrease Outcome: Progressing   Problem: Pain Managment: Goal: General experience of comfort will improve Outcome: Progressing   Problem: Skin Integrity: Goal: Risk for impaired skin integrity will decrease Outcome: Progressing   Problem: Safety: Goal: Ability to remain free from injury will improve Outcome: Progressing

## 2020-08-11 NOTE — ED Triage Notes (Signed)
Pt presents with wound to left thumb x 1 week. Thumb swollen, red, and warm to touch.

## 2020-08-11 NOTE — H&P (Signed)
History and Physical        Hospital Admission Note Date: 08/11/2020  Patient name: Kenneth Faulkner Medical record number: [id number] Date of birth: February 07, 1976 Age: 44 y.o. Gender: male  PCP: Patient, [name]    Patient coming from: HOME   I have reviewed all records in the [facility].    Chief Complaint:  Left thumb swelling and pain for the last 10-12 days  HPI: Patient is a 44 year old male with history of smoking otherwise no significant medical history presented to ED with left thumb wound for last 10 days, worse in the last 1 week.  Patient reported that he got a large wooden splinter at the base of palmar surface of his left thumb while working on an old door.  He was able to take the splinter out.  2-3 days later, he started noticing the swelling of his left arm with ulceration and serosanguineous discharge.  Patient reported that in the last 1 week, swelling continued to get worse, erythematous and painful to the entire time.  He did notice swelling on his left hand which however is improving.  No other injuries, no use of IV drugs.  Up-to-date with his tetanus vaccination. At the time of my examination, pain in the left thumb, 10/10, severe, constant, with pins-and-needles sensation, tearful.  ED work-up/course:  Temp 98.7, respiratory rate 20, HR 90, BP 144/79, O2 sats 100% on room air Covid 19 pending  Sodium 138, potassium 3.4, creatinine 0.9, LFTs normal, lactic acid 1.8, WBC 7.8, hemoglobin 13.3  Left hand x-ray showed severe soft tissue swelling left thumb, erosive changes about the distal aspect of the proximal phalanx and proximal aspect of distal phalanx of the left arm, suggestive of osteomyelitis and possible septic arthritis.  Associated fracture of the base of distal phalanx of the left thumb.  Tiny radiopaque foreign bodies in the soft  tissues adjacent to the left arm cannot be excluded. Nonunited angulated comminuted fracture of the proximal phalanx of the left second digit.   Review of Systems: Positives marked in 'bold' Constitutional: Denies fever, chills, diaphoresis, poor appetite and fatigue.  HEENT: Denies photophobia, eye pain, redness, hearing loss, ear pain, congestion, sore throat, rhinorrhea, sneezing, mouth sores, trouble swallowing, neck pain, neck stiffness and tinnitus.   Respiratory: Denies SOB, DOE, cough, chest tightness,  and wheezing.   Cardiovascular: Denies chest pain, palpitations and leg swelling.  Gastrointestinal: Denies nausea, vomiting, abdominal pain, diarrhea, constipation, blood in stool and abdominal distention.  Genitourinary: Denies dysuria, urgency, frequency, hematuria, flank pain and difficulty urinating.  Musculoskeletal: See HPI.  No focal weakness, no gait instability Skin: See HPI Neurological: Denies dizziness, seizures, syncope, weakness, light-headedness, numbness and headaches.  Hematological: Denies adenopathy. Easy bruising, personal or family bleeding history  Psychiatric/Behavioral: Denies suicidal ideation, mood changes, confusion, nervousness, sleep disturbance and agitation  Past Medical History: History reviewed. No pertinent past medical history.  History reviewed. No pertinent surgical history.  Medications: Prior to Admission medications   Medication Sig Start Date End Date Taking? Authorizing Provider  ibuprofen (ADVIL) 200 MG tablet Take 400-600 mg by mouth every 6 (six) hours as needed for mild pain or  moderate pain.   Yes [provider]  HYDROcodone-acetaminophen (NORCO/VICODIN) 5-325 MG tablet Take 1-2 tablets by mouth every 6 (six) hours as needed. Patient not taking: Reported on 08/11/2020 04/28/20   Petrucelli, Lelon Mast R, PA-C    Allergies:  No Known Allergies  Social History:  reports that he has been smoking. He has never used smokeless  tobacco. He reports current alcohol use. He reports current drug use. Drug: Marijuana.  Family History: No relevant family history, premature CAD, diabetes  Physical Exam: Blood pressure (!) 144/79, pulse 90, temperature 98.7 F (37.1 C), temperature source Oral, resp. rate 20, weight 81.6 kg, SpO2 100 %. General: Alert, awake, oriented x3, in no acute distress. Eyes: pink conjunctiva,anicteric sclera, pupils equal and reactive to light and accomodation, HEENT: normocephalic, atraumatic, oropharynx clear Neck: supple, no masses or lymphadenopathy, no goiter, no bruits, no JVD CVS: Regular rate and rhythm, without murmurs, rubs or gallops. No lower extremity edema Resp : Clear to auscultation bilaterally, no wheezing, rales or rhonchi. GI : Soft, nontender, nondistended, positive bowel sounds, no masses. No hepatomegaly. No hernia.  Musculoskeletal: No clubbing or cyanosis, positive pedal pulses. No contracture. ROM intact, see skin section Neuro: Grossly intact, no focal neurological deficits, strength 5/5 upper and lower extremities bilaterally Psych: alert and oriented x 3, normal mood and affect Skin: Left thumb fusiform swelling with ulceration, erythema with tenderness.  Swelling in the index finger       LABS on Admission: I have personally reviewed all the labs and imagings below    Basic Metabolic Panel: Recent Labs  Lab 08/11/20 0203  NA 138  K 3.4*  CL 103  CO2 23  GLUCOSE 127*  BUN 10  CREATININE 0.90  CALCIUM 8.8*   Liver Function Tests: Recent Labs  Lab 08/11/20 0203  AST 22  ALT 20  ALKPHOS 80  BILITOT 0.3  PROT 7.2  ALBUMIN 3.7   No results for input(s): LIPASE, AMYLASE in the last 168 hours. No results for input(s): AMMONIA in the last 168 hours. CBC: Recent Labs  Lab 08/11/20 0203  WBC 7.8  NEUTROABS 5.7  HGB 13.3  HCT 40.2  MCV 88.2  PLT 286   Cardiac Enzymes: No results for input(s): CKTOTAL, CKMB, CKMBINDEX, TROPONINI in the last  168 hours. BNP: Invalid input(s): POCBNP CBG: No results for input(s): GLUCAP in the last 168 hours.  Radiological Exams on Admission:  DG Hand Complete Left  Result Date: 08/11/2020 CLINICAL DATA:  Finger fractures.  Infected left thumb. EXAM: LEFT HAND - COMPLETE 3+ VIEW COMPARISON:  04/28/2020. FINDINGS: Extensive soft tissue swelling noted about the left thumb. Erosive changes noted about the distal aspect of the proximal phalanx and proximal aspect of distal phalanx of the thumb. These findings suggest osteomyelitis and possible septic arthritis. Associated fracture of the base of the distal phalanx of the left thumb noted. Nonunited angulated comminuted fracture of the proximal phalanx of the left second digit noted. Tiny radiopaque foreign bodies in the soft tissues adjacent to the left thumb cannot be excluded. IMPRESSION: 1. Severe soft tissue swelling left thumb. Erosive changes noted about the distal aspect of the proximal phalanx and proximal aspect of the distal phalanx of the left thumb. These findings suggest osteomyelitis and possible septic arthritis. Associated fracture of the base of the distal phalanx of the left thumb noted. Tiny radiopaque foreign bodies in the soft tissues adjacent to the left thumb cannot be excluded. 2. Nonunited angulated comminuted fracture of the proximal  phalanx of the left second digit noted Electronically Signed   By: Maisie Fus  Register   On: 08/11/2020 09:02   DG Finger Thumb Left  Result Date: 08/11/2020 CLINICAL DATA:  Rule out foreign body. Patient presents with wound to left thumb for 1 week. Thumb is rib swollen, red and warm to touch. Reported splinter 8 days ago. EXAM: LEFT THUMB 2+V COMPARISON:  Hand radiographs 04/28/2020 FINDINGS: Marked soft tissue swelling of the thumb. There is a minimally displaced intra-articular fracture through the lateral base of the distal phalanx. There is surrounding lucency of the distal phalanx and the adjacent head  of the proximal phalanx with associated erosive change along the volar aspect, best seen on the lateral. Punctate linear densities in the soft tissues of the volar and dorsal aspect of the thumb. 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 IMPRESSION: 1. Acute mildly displaced intra-articular fracture through the lateral base of the distal phalanx of the thumb. 2. Surrounding osseous lucency of the distal phalanx of the thumb and erosive change involving the adjacent head of the proximal phalanx, which is compatible with osteomyelitis. 3. Surrounding soft tissue swelling, compatible with cellulitis. Punctate linear densities along the volar and dorsal soft tissues of the thumb, which may represent small soft tissue foreign bodies or areas of mineralization. 4. 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 Dedicated hand radiographs could further evaluate if clinically indicated. Electronically Signed   By: Feliberto Harts MD   On: 08/11/2020 08:28      EKG: Independently reviewed.  None available   Assessment/Plan Principal Problem:   Osteomyelitis (HCC), left thumb, with cellulitis - Continue n.p.o. status, fluids, pain control -Placed on IV vancomycin, hand surgery consulted, plan for possible I&D today -Follow intraoperative cultures  Active Problems: Left index finger proximal phalanx fracture, nonunion -Per hand surgery  Hypokalemia -K3.4, potassium replaced IV    Nicotine use disorder -Smokes half pack per day, placed on nicotine patch -Counseled on smoking cessation  DVT prophylaxis: SCDs  CODE STATUS: Full code  Consults called: Hand surgery  Family Communication: Admission, patients condition and plan of care including tests being ordered have been discussed with the patient  who indicates understanding and agree with the plan and Code Status  Admission status:    The medical decision making on this patient was of high complexity and the patient is at high risk for clinical deterioration, therefore this is a level 3 admission.  Severity of Illness:      The appropriate patient status for this patient is INPATIENT. Inpatient status is judged to be reasonable and necessary in order to provide the required intensity of service to ensure the patient's safety. The patient's presenting symptoms, physical exam findings, and initial radiographic and laboratory data in the context of their chronic comorbidities is felt to place them at high risk for further clinical deterioration. Furthermore, it is not anticipated that the patient will be medically stable for discharge from the hospital within 2 midnights of admission. The following factors support the patient status of inpatient.   " The patient's presenting symptoms include left thumb cellulitis with osteomyelitis. " The worrisome physical exam findings include left thumb fusiform swelling with ulceration, TTP " The initial radiographic and laboratory data are worrisome because of abnormal x-ray with osteomyelitis " The chronic co-morbidities include nicotine use   * I certify that at the point of admission it is my clinical judgment that  the patient will require inpatient hospital care spanning beyond 2 midnights from the point of admission due to high intensity of service, high risk for further deterioration and high frequency of surveillance required.*    Time Spent on Admission: 70 mins      Tamey Wanek M.D. [facility] 08/11/2020, 9:29 AM

## 2020-08-11 NOTE — ED Provider Notes (Signed)
[name] EMERGENCY DEPARTMENT Provider Note   CSN: [id number] Arrival date & time: 08/11/20  0123     History Chief Complaint  Patient presents with  . Hand Pain    Kenneth Faulkner is a 44 y.o. male who presents to the ER with c/o L thumb infection. The patient got a large wooden splinter at the base of his left thumb on the palmar surface 5 days ago.  He has had increasing swelling, tenderness a large ulceration with purulent and serosanguineous discharge.  He states that the dorsum of the left hand was erythematous but that improved and he thought it was getting better however his pain is severe and he decided to come for further evaluation.  He denies any other injuries.  He is up-to-date on his tetanus vaccination.  HPI     History reviewed. No pertinent past medical history.  There are no problems to display for this patient.   History reviewed. No pertinent surgical history.     No family history on file.  Social History   Tobacco Use  . Smoking status: Current Every Day Smoker  . Smokeless tobacco: Never Used  Substance Use Topics  . Alcohol use: Yes  . Drug use: Yes    Types: Marijuana    Home Medications Prior to Admission medications   Medication Sig Start Date End Date Taking? Authorizing Provider  ibuprofen (ADVIL) 200 MG tablet Take 400-600 mg by mouth every 6 (six) hours as needed for mild pain or moderate pain.   Yes [provider]  HYDROcodone-acetaminophen (NORCO/VICODIN) 5-325 MG tablet Take 1-2 tablets by mouth every 6 (six) hours as needed. Patient not taking: Reported on 08/11/2020 04/28/20   Petrucelli, Pleas Koch, PA-C    Allergies    Patient has no known allergies.  Review of Systems   Review of Systems Ten systems reviewed and are negative for acute change, except as noted in the HPI.   Physical Exam Updated Vital Signs BP (!) 144/79 (BP Location: Right Arm)   Pulse 90   Temp 98.7 F (37.1 C)  (Oral)   Resp 20   SpO2 100%   Physical Exam Vitals and nursing note reviewed.  Constitutional:      General: He is not in acute distress.    Appearance: He is well-developed. He is not diaphoretic.  HENT:     Head: Normocephalic and atraumatic.  Eyes:     General: No scleral icterus.    Conjunctiva/sclera: Conjunctivae normal.  Cardiovascular:     Rate and Rhythm: Normal rate and regular rhythm.     Heart sounds: Normal heart sounds.  Pulmonary:     Effort: Pulmonary effort is normal. No respiratory distress.     Breath sounds: Normal breath sounds.  Abdominal:     Palpations: Abdomen is soft.     Tenderness: There is no abdominal tenderness.  Musculoskeletal:     Cervical back: Normal range of motion and neck supple.  Skin:    General: Skin is warm and dry.  Neurological:     Mental Status: He is alert.  Psychiatric:        Behavior: Behavior normal.                ED Results / Procedures / Treatments   Labs (all labs ordered are listed, but only abnormal results are displayed) Labs Reviewed  COMPREHENSIVE METABOLIC PANEL - Abnormal; Notable for the following components:      Result Value  Potassium 3.4 (*)    Glucose, Bld 127 (*)    Calcium 8.8 (*)    All other components within normal limits  URINALYSIS, ROUTINE W REFLEX MICROSCOPIC - Abnormal; Notable for the following components:   APPearance HAZY (*)    Bacteria, UA RARE (*)    All other components within normal limits  SARS CORONAVIRUS 2 BY RT PCR (HOSPITAL ORDER, PERFORMED IN [facility])  LACTIC ACID, PLASMA  CBC WITH DIFFERENTIAL/PLATELET    EKG None  Radiology DG Finger Thumb Left  Result Date: 08/11/2020 CLINICAL DATA:  Rule out foreign body. Patient presents with wound to left thumb for 1 week. Thumb is rib swollen, red and warm to touch. Reported splinter 8 days ago. EXAM: LEFT THUMB 2+V COMPARISON:  Hand radiographs 04/28/2020 FINDINGS: Marked soft tissue swelling of  the thumb. There is a minimally displaced intra-articular fracture through the lateral base of the distal phalanx. There is surrounding lucency of the distal phalanx and the adjacent head of the proximal phalanx with associated erosive change along the volar aspect, best seen on the lateral. Punctate linear densities in the soft tissues of the volar and dorsal aspect of the thumb. 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 IMPRESSION: 1. Acute mildly displaced intra-articular fracture through the lateral base of the distal phalanx of the thumb. 2. Surrounding osseous lucency of the distal phalanx of the thumb and erosive change involving the adjacent head of the proximal phalanx, which is compatible with osteomyelitis. 3. Surrounding soft tissue swelling, compatible with cellulitis. Punctate linear densities along the volar and dorsal soft tissues of the thumb, which may represent small soft tissue foreign bodies or areas of mineralization. 4. 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 Dedicated hand radiographs could further evaluate if clinically indicated. Electronically Signed   By: Feliberto HartsFrederick S Jones MD   On: 08/11/2020 08:28    Procedures Procedures (including critical care time)  Medications Ordered in ED Medications  clindamycin (CLEOCIN) IVPB 600 mg (has no administration in time range)  HYDROmorphone (DILAUDID) injection 0.5 mg (has no administration in time range)    ED Course  I have reviewed the triage vital signs and the nursing notes.  Pertinent labs & imaging results that were available during my care of the patient were reviewed by me and considered in my medical decision making (see chart for details).  Clinical Course as of Aug 11 913  Fri Aug 11, 2020  0729 Bacteria, UA(!): RARE [AH]  [id number] 44 yo male previously healthy male presenting to the Ed with left thumb swelling  and pain.  Reportedly got a splinter stuck in base of his left thumb a few days ago, and removed what he felt was the entire splinter.  However his thumb has gradually become inflammed, tender, and swollen over the past few days.  He denies fevers, chills, numbness in his hand.  He denies hx of IVDA or diabetes.  He reports no other medical problems and NKDA.  On exam he is afebrile, normal HR, and generally well appearing, but does have a diffusely edematous, swollen, and tender left thumb, concerning for infectious tenosynovitis.  He has fusiform swelling, ttp of flexor surface, pain with extension of the digit.  Labs with normal lactate and no leukocytosis - doubt sepsis at this time.  Plan for IV clindamycin, dilaudid for pain, NPO and hand surgery evaluation.   [MT]  [id number] Thumb x-ray  shows bony erosion of the distal phalanx concerning for osteomyelitis.  Patient was already given clindamycin.  I have broadened coverage to include coverage for Pseudomonas with cefepime and vancomycin.  Patient also has a fracture of the second digit of that hand and we will get a full hand x-ray to evaluate the other bones to make sure we do not see any other fractures.  Patient states that he had a fall and broke his finger about a month ago.   [AH]  8182 Xray concerning for osteomyelitis and 2nd digit fx.  Plan to broaden antibiotic coverage, will need admisison.   [MT]    Clinical Course User Index [AH] Arthor Captain, PA-C [MT] Renaye Rakers, Kermit Balo, MD   MDM Rules/Calculators/A&P                           44 year old male here with left thumb injury.  This is a penetrating into the thumb with foreign body about 5 days ago.  Physical exam shows a fusiform swollen thumb.  Tenderness along the flexor tendon. Exquisite pain. Likely flexor tenosynovitis.     CC: left thumb pain  VS: BP (!) 144/79 (BP Location: Right Arm)   Pulse 90   Temp 98.7 F (37.1 C) (Oral)   Resp 20   Wt 81.6 kg   SpO2 100%   BMI  29.05 kg/m  XH:BZJIRCV is gathered by patient and EMR. Previous records obtained and reviewed. DDX:The patient's complaint of some pain involves an extensive number of diagnostic and treatment options, and is a complaint that carries with it a high risk of complications, morbidity, and potential mortality. Given the large differential diagnosis, medical decision making is of high complexity.  Cellulitis, flexor tenosynovitis,septic arthritis and osteomyelitis Labs: I ordered reviewed and interpreted labs which include  CBC with abnormality CMP with mildly elevated blood glucose, likely acute phase reaction Lactic acid wnl UA without abnormality Imaging: I ordered and reviewed images which included Left Thumb and hand xray. I independently visualized and interpreted all imaging. Significant findings include fracture of the left 2nd digit with non-union and distal phalanx erosive changes of the left thumb with concern for osteomyelitis, possible septic arthritis..  EKG: Consults: Consult placed with PA Charma Igo who is seen the patient at bedside.  I discussed the case with Dr. Isidoro Donning who will admit the patient to the hospitalist service. MDM: Patient here with infection after puncture wound of the left thumb.  Patient with osteomyelitis, questionable septic joint, questional flexor tenosynovitis.  I have ordered broad-spectrum antibiotics including cefepime and vancomycin.  Patient be admitted to the hospitalist service. Patient disposition: The patient appears reasonably stabilized for admission considering the current resources, flow, and capabilities available in the ED at this time, and I doubt any other [facility] requiring further screening and/or treatment in the ED prior to admission.    Kenneth Faulkner was evaluated in Emergency Department on 08/11/2020 for the symptoms described in the history of present illness. He was evaluated in the context of the global COVID-19 pandemic, which  necessitated consideration that the patient might be at risk for infection with the SARS-CoV-2 virus that causes COVID-19. Institutional protocols and algorithms that pertain to the evaluation of patients at risk for COVID-19 are in a state of rapid change based on information released by regulatory bodies including the CDC and federal and state organizations. These policies and algorithms were followed during the patient's care in the ED.  Final Clinical Impression(s) / ED Diagnoses Final diagnoses:  Osteomyelitis, unspecified site, unspecified type [facility])    Rx / DC Orders ED Discharge Orders    None       Arthor Captain, PA-C 08/11/20 0933    Terald Sleeper, MD 08/11/20 1225

## 2020-08-11 NOTE — Progress Notes (Signed)
   08/11/20 1519  Vitals  Temp 97.8 F (36.6 C)  Temp Source Oral  BP (!) 151/91  BP Location Right Arm  BP Method Automatic  Patient Position (if appropriate) Lying  Pulse Rate 67  Pulse Rate Source Dinamap  Resp 18  Level of Consciousness  Level of Consciousness Alert  MEWS COLOR  MEWS Score Color Green  Oxygen Therapy  SpO2 98 %  O2 Device Room Air  Pain Assessment  Pain Scale 0-10  Pain Score 0  ECG Monitoring  Cardiac Rhythm NSR  MEWS Score  MEWS Temp 0  MEWS Systolic 0  MEWS Pulse 0  MEWS RR 0  MEWS LOC 0  MEWS Score 0

## 2020-08-11 NOTE — Consult Note (Signed)
Reason for Consult:Left thumb infection Referring Physician: D Rhyse Loux is an 44 y.o. male.  HPI: Kenneth Faulkner was working on an old door about a week ago when he got a very long and large splinter imbedded at the base of his thumb. He was able to pull it out and has been doctoring it at home with Epsom salt soaks but it has continued to swell and get more painful. He came to the ED for evaluation and x-rays showed osteo and hand surgery was consulted. He is RHD.  History reviewed. No pertinent past medical history.  History reviewed. No pertinent surgical history.  No family history on file.  Social History:  reports that he has been smoking. He has never used smokeless tobacco. He reports current alcohol use. He reports current drug use. Drug: Marijuana.  Allergies: No Known Allergies  Medications: I have reviewed the patient's current medications.  Results for orders placed or performed during the hospital encounter of 08/11/20 (from the past 48 hour(s))  Urinalysis, Routine w reflex microscopic Urine, Clean Catch     Status: Abnormal   Collection Time: 08/11/20  1:53 AM  Result Value Ref Range   Color, Urine YELLOW YELLOW   APPearance HAZY (A) CLEAR   Specific Gravity, Urine 1.027 1.005 - 1.030   pH 5.0 5.0 - 8.0   Glucose, UA NEGATIVE NEGATIVE mg/dL   Hgb urine dipstick NEGATIVE NEGATIVE   Bilirubin Urine NEGATIVE NEGATIVE   Ketones, ur NEGATIVE NEGATIVE mg/dL   Protein, ur NEGATIVE NEGATIVE mg/dL   Nitrite NEGATIVE NEGATIVE   Leukocytes,Ua NEGATIVE NEGATIVE   RBC / HPF 0-5 0 - 5 RBC/hpf   WBC, UA 0-5 0 - 5 WBC/hpf   Bacteria, UA RARE (A) NONE SEEN   Mucus PRESENT    Ca Oxalate Crys, UA PRESENT     Comment: Performed at [facility], [address] [place], [place] [address]  Lactic acid, plasma     Status: None   Collection Time: 08/11/20  2:03 AM  Result Value Ref Range   Lactic Acid, Venous 1.8 0.5 - 1.9 mmol/L    Comment: Performed at [facility], [address] [place], [place] [address]  Comprehensive metabolic panel     Status: Abnormal   Collection Time: 08/11/20  2:03 AM  Result Value Ref Range   Sodium 138 135 - 145 mmol/L   Potassium 3.4 (L) 3.5 - 5.1 mmol/L   Chloride 103 98 - 111 mmol/L   CO2 23 22 - 32 mmol/L   Glucose, Bld 127 (H) 70 - 99 mg/dL    Comment: Glucose reference range applies only to samples taken after fasting for at least 8 hours.   BUN 10 6 - 20 mg/dL   Creatinine, Ser 1.69 0.61 - 1.24 mg/dL   Calcium 8.8 (L) 8.9 - 10.3 mg/dL   Total Protein 7.2 6.5 - 8.1 g/dL   Albumin 3.7 3.5 - 5.0 g/dL   AST 22 15 - 41 U/L   ALT 20 0 - 44 U/L   Alkaline Phosphatase 80 38 - 126 U/L   Total Bilirubin 0.3 0.3 - 1.2 mg/dL   GFR calc non Af Amer >60 >60 mL/min   GFR calc Af Amer >60 >60 mL/min   Anion gap 12 5 - 15    Comment: Performed at [facility], [address], [place], [place] [address]  CBC with Differential     Status: None  Collection Time: 08/11/20  2:03 AM  Result Value Ref Range   WBC 7.8 4.0 - 10.5 K/uL   RBC 4.56 4.22 - 5.81 MIL/uL   Hemoglobin 13.3 13.0 - 17.0 g/dL   HCT 94.8 39 - 52 %   MCV 88.2 80.0 - 100.0 fL   MCH 29.2 26.0 - 34.0 pg   MCHC 33.1 30.0 - 36.0 g/dL   RDW 54.6 27.0 - 35.0 %   Platelets 286 150 - 400 K/uL   nRBC 0.0 0.0 - 0.2 %   Neutrophils Relative % 74 %   Neutro Abs 5.7 1.7 - 7.7 K/uL   Lymphocytes Relative 14 %   Lymphs Abs 1.1 0.7 - 4.0 K/uL   Monocytes Relative 7 %   Monocytes Absolute 0.5 0 - 1 K/uL   Eosinophils Relative 5 %   Eosinophils Absolute 0.4 0 - 0 K/uL   Basophils Relative 0 %   Basophils Absolute 0.0 0 - 0 K/uL   Immature Granulocytes 0 %   Abs Immature Granulocytes 0.02 0.00 - 0.07 K/uL    Comment: Performed at [facility], [address] [place], [place] [address]    DG Hand Complete Left  Result Date: 08/11/2020 CLINICAL DATA:  Finger fractures.  Infected left thumb. EXAM: LEFT HAND - COMPLETE 3+ VIEW COMPARISON:   04/28/2020. FINDINGS: Extensive soft tissue swelling noted about the left thumb. Erosive changes noted about the distal aspect of the proximal phalanx and proximal aspect of distal phalanx of the thumb. These findings suggest osteomyelitis and possible septic arthritis. Associated fracture of the base of the distal phalanx of the left thumb noted. Nonunited angulated comminuted fracture of the proximal phalanx of the left second digit noted. Tiny radiopaque foreign bodies in the soft tissues adjacent to the left thumb cannot be excluded. IMPRESSION: 1. Severe soft tissue swelling left thumb. Erosive changes noted about the distal aspect of the proximal phalanx and proximal aspect of the distal phalanx of the left thumb. These findings suggest osteomyelitis and possible septic arthritis. Associated fracture of the base of the distal phalanx of the left thumb noted. Tiny radiopaque foreign bodies in the soft tissues adjacent to the left thumb cannot be excluded. 2. Nonunited angulated comminuted fracture of the proximal phalanx of the left second digit noted Electronically Signed   By: Maisie Fus  Register   On: 08/11/2020 09:02   DG Finger Thumb Left  Result Date: 08/11/2020 CLINICAL DATA:  Rule out foreign body. Patient presents with wound to left thumb for 1 week. Thumb is rib swollen, red and warm to touch. Reported splinter 8 days ago. EXAM: LEFT THUMB 2+V COMPARISON:  Hand radiographs 04/28/2020 FINDINGS: Marked soft tissue swelling of the thumb. There is a minimally displaced intra-articular fracture through the lateral base of the distal phalanx. There is surrounding lucency of the distal phalanx and the adjacent head of the proximal phalanx with associated erosive change along the volar aspect, best seen on the lateral. Punctate linear densities in the soft tissues of the volar and dorsal aspect of the thumb. Partially imaged comminuted fracture of the second proximal phalanx with nonunion and intraarticular  extension. IMPRESSION: 1. Acute mildly displaced intra-articular fracture through the lateral base of the distal phalanx of the thumb. 2. Surrounding osseous lucency of the distal phalanx of the thumb and erosive change involving the adjacent head of the proximal phalanx, which is compatible with osteomyelitis. 3. Surrounding soft tissue swelling, compatible with cellulitis. Punctate linear densities along the  volar and dorsal soft tissues of the thumb, which may represent small soft tissue foreign bodies or areas of mineralization. 4. 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 Dedicated hand radiographs could further evaluate if clinically indicated. Electronically Signed   By: Feliberto Harts MD   On: 08/11/2020 08:28    Review of Systems  Constitutional: Negative for chills, diaphoresis and fever.  HENT: Negative for ear discharge, ear pain, hearing loss and tinnitus.   Eyes: Negative for photophobia and pain.  Respiratory: Negative for cough and shortness of breath.   Cardiovascular: Negative for chest pain.  Gastrointestinal: Negative for abdominal pain, nausea and vomiting.  Genitourinary: Negative for dysuria, flank pain, frequency and urgency.  Musculoskeletal: Positive for arthralgias (Left thumb). Negative for back pain, myalgias and neck pain.  Neurological: Negative for dizziness and headaches.  Hematological: Does not bruise/bleed easily.  Psychiatric/Behavioral: The patient is not nervous/anxious.    Blood pressure (!) 144/79, pulse 90, temperature 98.7 F (37.1 C), temperature source Oral, resp. rate 20, weight 81.6 kg, SpO2 100 %. Physical Exam Constitutional:      General: He is not in acute distress.    Appearance: He is well-developed. He is not diaphoretic.  HENT:     Head: Normocephalic and atraumatic.  Eyes:     General: No scleral icterus.       Right eye: No discharge.        Left eye: No discharge.     Conjunctiva/sclera:  Conjunctivae normal.  Cardiovascular:     Rate and Rhythm: Normal rate and regular rhythm.  Pulmonary:     Effort: Pulmonary effort is normal. No respiratory distress.  Musculoskeletal:     Cervical back: Normal range of motion.     Comments: Left shoulder, elbow, wrist, digits- Ulceration thumb, fusiform swelling with severe TTP, also swelling in index finger P1 but no TTP, no instability, no blocks to motion  Sens  Ax/R/M/U intact  Mot   Ax/ R/ PIN/ M/ AIN/ U intact  Rad 2+  Skin:    General: Skin is warm and dry.  Neurological:     Mental Status: He is alert.  Psychiatric:        Behavior: Behavior normal.     Assessment/Plan: Left thumb osteo -- Will need I&D by Dr. Amanda Pea this afternoon. Please keep NPO. Suggest ID consult, will need long-term abx treatment if he's going to keep his thumb. I did warn him it may not be possible to avoid amputation. Left index prox phalanx non-union -- Don't think there's any remedy at this point but will let Dr. Amanda Pea evaluate. He has good fxn in the finger and it does not hurt him.    Freeman Caldron, PA-C Orthopedic Surgery [phone] 08/11/2020, 9:21 AM

## 2020-08-11 NOTE — Progress Notes (Signed)
Pharmacy Antibiotic Note  Nester Bachus is a 44 y.o. male admitted on 08/11/2020 with imaging showing L-thumb osteo. Also noted hx of puncture wound to this area.  Pharmacy has been consulted for Vancomycin and Cefepime dosing.  SCr 0.9, CrCl~100 ml/min.   Plan: - Vancomycin 1500 mg IV x 1 followed by 1g/8h - Cefepime 2g IV x 1 followed by 2g IV every 8 hours - Will continue to follow renal function, culture results, LOT, and antibiotic de-escalation plans    Weight: 81.6 kg (180 lb)  Temp (24hrs), Avg:98.6 F (37 C), Min:98.4 F (36.9 C), Max:98.7 F (37.1 C)  Recent Labs  Lab 08/11/20 0203  WBC 7.8  CREATININE 0.90  LATICACIDVEN 1.8    Estimated Creatinine Clearance: 102.8 mL/min (by C-G formula based on SCr of 0.9 mg/dL).    No Known Allergies  Antimicrobials this admission: Vanc 9/10 >> Cefepime 9/10 >>  Dose adjustments this admission:   Microbiology results: 9/10 COVID >>  Thank you for allowing pharmacy to be a part of this patient's care.  Georgina Pillion, PharmD, BCPS Clinical Pharmacist Clinical phone for 08/11/2020: [phone] 08/11/2020 8:53 AM   **Pharmacist phone directory can now be found on [email or web address] ([email or web address]).  Listed under [facility].

## 2020-08-11 NOTE — Op Note (Signed)
Operative note August 11, 2020  Preoperative diagnosis osteomyelitis left thumb with acute draining infection and sinus tract formation a.m. tenosynovitis  Postop diagnosis: The same  Operative procedure #1 irrigation debridement abscess deep in location left thumb #2 Bony debridement and resection of destroyed distal interphalangeal joint.  This was a resection of the DIP joint where the cartilaginous tissue was crumbled.  #3 flexor pollicis longus tenolysis tenosynovectomy secondary to infectious tenosynovitis #4 extensor tendon tenolysis tenosynovectomy secondary to infectious tenosynovitis #5 radical synovectomy and arthrotomy IP joint left thumb  Anesthesia General  Surgeon Dominica Severin  Estimate blood loss less than 50 cc  Cultures soft tissue fluid and bone cultures taken  Description of procedure the patient is a 44 year old male who presents with coronavirus and osteomyelitis about his left thumb.  Please see history and physical.  Patient had evolving infection.  He presents with lytic destruction of his IP joint of the left thumb.  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  This is nothing acute.  His acute problem is the acute osteomyelitis left thumb with draining sinus tract formation and destruction of the joint.  I reviewed this with patient at length and the findings.  Patient was taken to the operative suite coronavirus precautions taken following this he underwent general anesthetic he was prepped and draped with Hibiclens scrub x2 followed by Betadine scrub and paint outlined marks were visualized tourniquet insufflated and timeout observed.  I removed the sinus tract formation about the radial aspect of the IP joint.  I immediately encountered purulent material the purulent tissue was cultured for fluid and tissue culture with aerobic and anaerobic cultures as well as fungal and atypical mycobacterial cultures.   Following this I then entered the IP joint and noted that the cartilage was fragmented and he had a well-developed infection with lytic changes about the distal phalanx and proximal phalanx.  As the joint was destroyed the patient then had bone cultures taken with cartilaginous flecks.  There is no cartilage that was healthy.  This was removed in hopes that perhaps this could be salvaged and autofused or surgically fused in the future if he clears his infection.  By definition this patient has distal phalanx and proximal phalanx osteomyelitis.  I then performed a flexor tenolysis tenosynovectomy of the extensor and flexor apparatus.  Both the extensor and flexor apparatus had infectious findings.  I performed a radical extensor tendon tenolysis tenosynovectomy.  Following this arthrotomy and synovectomy was aggressively performed an additional bone culture taken about the IP joint.  Following this I then identified remnants of the volar plate about the IP joint and the FPL tendon which was debrided and underwent a tenolysis tenosynovectomy.  The patient tolerated this reasonably well.  I then placed 4 L of fluid through and through the area.  Following this we then kept the wound open with the tourniquet deflated and refill looking well.  His hand was dressed and placed in maximum elevation.  He will be admitted.  We will plan for infectious disease consult and IV antibiotics.  I will plan for additional washout Sunday.  We will do everything we can to salvage his thumb although an amputation would be a reasonable option given the severity of findings and lytic changes we see today.  All questions have been addressed encouraged and answered  Dominica Severin MD

## 2020-08-11 NOTE — TOC Initial Note (Addendum)
Transition of Care [facility]) - Initial/Assessment Note    Patient Details  Name: Kenneth Faulkner MRN: [id number] Date of Birth: [birth year]  [facility] of [facility]) CM/SW Contact:    Lockie Pares, RN Phone Number: 08/11/2020, 3:56 PM  Clinical Narrative:                 Admitted for osteomyelitis left thumb, tracheary COVID positive. No oxygen or symptoms form COVID. Will need II&D today with antibiotic therapy question length of time for antibiotic therapy.  CM will follow for needs Already made COVID FU appt at clinic, will also need orthopedic FU  Expected Discharge Plan: Home/Self Care Barriers to Discharge: Continued Medical Work up   Patient Goals and CMS Choice        Expected Discharge Plan and Services Expected Discharge Plan: Home/Self Care       Living arrangements for the past 2 months: Single Family Home                                      Prior Living Arrangements/Services Living arrangements for the past 2 months: Single Family Home Lives with:: Significant Other Patient language and need for interpreter reviewed:: Yes        Need for Family Participation in Patient Care: Yes (Comment) Care giver support system in place?: Yes (comment)   Criminal Activity/Legal Involvement Pertinent to Current Situation/Hospitalization: No - Comment as needed  Activities of Daily Living      Permission Sought/Granted                  Emotional Assessment       Orientation: : Oriented to Self, Oriented to Place, Oriented to  Time, Oriented to Situation Alcohol / Substance Use: Alcohol Use, Illicit Drugs, Tobacco Use (smoker ETOH and Marajuana) Psych Involvement: No (comment)  Admission diagnosis:  Wound cellulitis [L03.90] Osteomyelitis, unspecified site, unspecified type [facility]) [M86.9] Patient Active Problem List   Diagnosis Date Noted  . Wound cellulitis 08/11/2020  . Nicotine use disorder 08/11/2020  . Osteomyelitis (HCC), left thumb  08/11/2020   PCP:  Patient, [name] Pharmacy:   [id number] - [place], [place] - [address] AT [facility] [address] [place] [address] Phone: [phone] Fax: [phone]  [id number] - [address] ([place]), [place] - [address]  ([place]) [place] [address] Phone: [phone] Fax: [phone]     Social Determinants of Health (SDOH) Interventions    Readmission Risk Interventions No flowsheet data found.

## 2020-08-11 NOTE — Anesthesia Preprocedure Evaluation (Addendum)
Anesthesia Evaluation  Patient identified by MRN, date of birth, ID band Patient awake    Reviewed: Allergy & Precautions, NPO status , Patient's Chart, lab work & pertinent test results  Airway Mallampati: II  TM Distance: >3 FB Neck ROM: Full    Dental  (+) Edentulous Upper, Dental Advisory Given   Pulmonary Current Smoker,  COVID +   breath sounds clear to auscultation       Cardiovascular negative cardio ROS   Rhythm:Regular Rate:Normal     Neuro/Psych negative neurological ROS     GI/Hepatic negative GI ROS, (+)     substance abuse  marijuana use,   Endo/Other  negative endocrine ROS  Renal/GU negative Renal ROS     Musculoskeletal negative musculoskeletal ROS (+)   Abdominal   Peds  Hematology negative hematology ROS (+)   Anesthesia Other Findings Left thumb infection   Reproductive/Obstetrics                            Anesthesia Physical Anesthesia Plan  ASA: II  Anesthesia Plan: General   Post-op Pain Management:    Induction: Intravenous and Rapid sequence  PONV Risk Score and Plan: 2 and Ondansetron, Midazolam and Dexamethasone  Airway Management Planned: Oral ETT  Additional Equipment: None  Intra-op Plan:   Post-operative Plan: Extubation in OR  Informed Consent: I have reviewed the patients History and Physical, chart, labs and discussed the procedure including the risks, benefits and alternatives for the proposed anesthesia with the patient or authorized representative who has indicated his/her understanding and acceptance.     Dental advisory given  Plan Discussed with: CRNA  Anesthesia Plan Comments:        Anesthesia Quick Evaluation

## 2020-08-11 NOTE — Transfer of Care (Signed)
Immediate Anesthesia Transfer of Care Note  Patient: Kenneth Faulkner  Procedure(s) Performed: IRRIGATION AND DEBRIDEMENT OF THUMB (Left )  Patient Location: PACU  Anesthesia Type:General  Level of Consciousness: drowsy  Airway & Oxygen Therapy: Patient Spontanous Breathing and Patient connected to face mask oxygen  Post-op Assessment: Report given to RN and Post -op Vital signs reviewed and stable  Post vital signs: Reviewed and stable  Last Vitals:  Vitals Value Taken Time  BP    Temp    Pulse    Resp    SpO2      Last Pain:  Vitals:   08/11/20 1519  TempSrc: Oral  PainSc: 0-No pain         Complications: No complications documented.

## 2020-08-12 ENCOUNTER — Inpatient Hospital Stay (HOSPITAL_COMMUNITY): Payer: Self-pay

## 2020-08-12 ENCOUNTER — Encounter (HOSPITAL_COMMUNITY): Payer: Self-pay | Admitting: Orthopedic Surgery

## 2020-08-12 DIAGNOSIS — U071 COVID-19: Secondary | ICD-10-CM

## 2020-08-12 LAB — CBC
HCT: 39.7 % (ref 39.0–52.0)
Hemoglobin: 13.3 g/dL (ref 13.0–17.0)
MCH: 29.6 pg (ref 26.0–34.0)
MCHC: 33.5 g/dL (ref 30.0–36.0)
MCV: 88.2 fL (ref 80.0–100.0)
Platelets: 223 10*3/uL (ref 150–400)
RBC: 4.5 MIL/uL (ref 4.22–5.81)
RDW: 12.4 % (ref 11.5–15.5)
WBC: 4.3 10*3/uL (ref 4.0–10.5)
nRBC: 0 % (ref 0.0–0.2)

## 2020-08-12 LAB — HEPATIC FUNCTION PANEL
ALT: 15 U/L (ref 0–44)
AST: 18 U/L (ref 15–41)
Albumin: 3 g/dL — ABNORMAL LOW (ref 3.5–5.0)
Alkaline Phosphatase: 78 U/L (ref 38–126)
Bilirubin, Direct: <0.1 mg/dL (ref 0.0–0.2)
Total Bilirubin: 0.5 mg/dL (ref 0.3–1.2)
Total Protein: 6 g/dL — ABNORMAL LOW (ref 6.5–8.1)

## 2020-08-12 LAB — BASIC METABOLIC PANEL
Anion gap: 8 (ref 5–15)
BUN: 5 mg/dL — ABNORMAL LOW (ref 6–20)
CO2: 27 mmol/L (ref 22–32)
Calcium: 8.2 mg/dL — ABNORMAL LOW (ref 8.9–10.3)
Chloride: 103 mmol/L (ref 98–111)
Creatinine, Ser: 0.83 mg/dL (ref 0.61–1.24)
GFR calc Af Amer: >60 mL/min (ref >60)
GFR calc non Af Amer: >60 mL/min (ref >60)
Glucose, Bld: 123 mg/dL — ABNORMAL HIGH (ref 70–99)
Potassium: 3.5 mmol/L (ref 3.5–5.1)
Sodium: 138 mmol/L (ref 135–145)

## 2020-08-12 LAB — D-DIMER, QUANTITATIVE: D-Dimer, Quant: 0.69 ug/mL-FEU — ABNORMAL HIGH (ref 0.00–0.50)

## 2020-08-12 LAB — FERRITIN: Ferritin: 111 ng/mL (ref 24–336)

## 2020-08-12 LAB — C-REACTIVE PROTEIN: CRP: 2.4 mg/dL — ABNORMAL HIGH (ref <1.0)

## 2020-08-12 LAB — HIV ANTIBODY (ROUTINE TESTING W REFLEX): HIV Screen 4th Generation wRfx: NONREACTIVE

## 2020-08-12 MED ORDER — SODIUM CHLORIDE 0.9 % IV SOLN: INTRAVENOUS | Status: DC | PRN

## 2020-08-12 MED ORDER — DIPHENHYDRAMINE HCL 50 MG/ML IJ SOLN: 50.0000 mg | Freq: Once | INTRAMUSCULAR | Status: DC | PRN

## 2020-08-12 MED ORDER — FAMOTIDINE IN NACL 20-0.9 MG/50ML-% IV SOLN
20.0000 mg | Freq: Once | INTRAVENOUS | Status: DC | PRN
  Filled 2020-08-12: qty 50

## 2020-08-12 MED ORDER — ALBUTEROL SULFATE HFA 108 (90 BASE) MCG/ACT IN AERS
2.0000 | INHALATION_SPRAY | Freq: Once | RESPIRATORY_TRACT | Status: DC | PRN
  Filled 2020-08-12: qty 6.7

## 2020-08-12 MED ORDER — ENOXAPARIN SODIUM 40 MG/0.4ML ~~LOC~~ SOLN
40.0000 mg | SUBCUTANEOUS | Status: DC
  Administered 2020-08-12 – 2020-08-15 (×4): 40 mg via SUBCUTANEOUS
  Filled 2020-08-12 (×5): qty 0.4

## 2020-08-12 MED ORDER — METHYLPREDNISOLONE SODIUM SUCC 125 MG IJ SOLR: 125.0000 mg | Freq: Once | INTRAMUSCULAR | Status: DC | PRN

## 2020-08-12 MED ORDER — EPINEPHRINE 0.3 MG/0.3ML IJ SOAJ
0.3000 mg | Freq: Once | INTRAMUSCULAR | Status: DC | PRN
  Filled 2020-08-12: qty 0.6

## 2020-08-12 MED ORDER — SODIUM CHLORIDE 0.9 % IV SOLN
1200.0000 mg | Freq: Once | INTRAVENOUS | Status: AC
  Administered 2020-08-12: 1200 mg via INTRAVENOUS
  Filled 2020-08-12 (×2): qty 10

## 2020-08-12 NOTE — Progress Notes (Signed)
Patient ID: Kenneth Faulkner, male   DOB: [birth year], 44 y.o.   MRN: [id number] Patient seen at bedside.  Vital signs stable.  Patient states he has pain in his left thumb.  This is expected.  I discussed him the surgical findings.  He has significant osteomyelitis about his thumb with destruction of the IP joint.  He has significant loss of bone and distal phalanx as well as proximal phalanx osteomyelitis.  I have noted the cultures show gram-positive cocci.  Will await further growth and identification of bacterial species.  Currently he is on broad-spectrum antibiotics for this purpose.  I discussed with the patient the challenges moving forward.  I discussed with him he has a completely destroyed joint.  Options are #1 consideration for amputation as a cure however this would require a significant loss of the thumb length to the MCP level.  This would be a significant impairment for any person as he and I both note.  Option #2 which I would favor is an aggressive approach of trying to heal the area.  This would require additional surgery with repeat washout followed by long-term IV antibiotics based upon the organism and sensitivities to try and eradicate the deep bone infection.  If successful later down the road one could consider an IP joint fusion for improvement of function.  On a rare occasion these will auto fuse I discussed with the patient.  Unfortunately his gap is so significant and the bone destruction is so notable that I doubt an autofusion will occur however we discussed this as it is in the realm of possibilities.  Given all issues we will plan for an attempted reconstructive approach as outlined above with surgery tomorrow.  This will likely be the last surgery and then will just simply await cultures.  Patient understands this.  I would certainly consider and recommend getting infectious disease on board so that we can tailor the antibiotics once we get a more defined  position on his organism.  All questions have been addressed  Amanda Pea MD

## 2020-08-12 NOTE — Progress Notes (Signed)
Pharmacy COVID-19 Monoclonal Antibody Screening  Kenneth Faulkner was identified as being not hospitalized with symptoms from Covid-19 on admission but an incidental positive PCR has been documented.  The patient may qualify for the use of monoclonal antibodies (mAB) for COVID-19 viral infection to prevent worsening symptoms stemming from Covid-19 infection.  The patient was identified based on a positive COVID-19 PCR and not requiring the use of supplemental oxygen at this time.  This patient meets the FDA criteria for Emergency Use Authorization of casirivimab/imdevimab.  Has a (+) direct SARS-CoV-2 viral test result  Is NOT hospitalized due to COVID-19  Is within 10 days of symptom onset  Has at least one of the high risk factor(s) for progression to severe COVID-19 and/or hospitalization as defined in EUA.  Specific high risk criteria : BMI > 25  Additionally: The patient has had a positive COVID-19 PCR in the last 90 days.  The patient is fully vaccinated against COVID-19.  Since the patient is partially or fully vaccinated for COVID-19, is asymptomatic with a cycle time of < 32, and meets high risk criteria, the patient is eligible for mAB administration.  (CT 26)  This eligibility and indication for treatment was discussed with the patient's physician: Dr Dina Rich  Plan: Based on the above discussion, it was decided that the patient will receive one dose of mAB combination.Casirivimab/imdevimab has been ordered. Pharmacy will coordinate administration timing with patient's nurse. Recommended infusion monitoring parameters communicated to the nursing team.  Elmer Sow, PharmD, BCPS, BCCCP Clinical Pharmacist [phone]  Please check AMION for all [facility] numbers  08/12/2020 11:09 AM

## 2020-08-12 NOTE — Progress Notes (Addendum)
PROGRESS NOTE                                                                                                                                                                                                             Patient Demographics:    Kenneth Faulkner, is a 44 y.o. male, DOB - 09/01/1976, MVH:[id number]  Outpatient Primary MD for the patient is Patient, [name]   Admit date - 08/11/2020   LOS - 1  Chief Complaint  Patient presents with  . Hand Pain       Brief Narrative: Patient is a 44 y.o. male with no past medical history-who had a wood splinter puncture to his left thumb-subsequently had pain and swelling for approximately 10-12 days before seeking medical attention in the emergency room on 9/10-found to have soft tissue infection with underlying osteomyelitis and incidental COVID-19 infection.  Admitted to the hospitalist service for further evaluation and treatment.  COVID-19 vaccinated status: Vaccinated  Significant Events: 9/10>> Admit to [facility] for left thumb cellulitis with soft tissue infection-incidental COVID-19 infection  Significant studies: 9/11>>Chest x-ray: No acute cardiopulmonary process  COVID-19 medications: None  Antibiotics: Vancomycin: 9/10>> Cefepime: 9/10>>  Microbiology data: 9/19>> wound culture/intraoperative culture: Pending  Procedures: 9/10>> irrigation and debridement abscess left thumb, bony debridement and resection of destroyed DIP  Consults: None  DVT prophylaxis: SCDs Start: 08/11/20 1756  Prophylactic Lovenox    Subjective:    Kenneth Faulkner today has pain at the operative site-but no other complaints.  He has no chest pain, shortness of breath, cough, nasal congestion or URI symptoms.   Assessment  & Plan :   Acute Left thumb osteomyelitis: Postop care deferred to hand surgery-Per operative note-additional surgical procedure is tentatively planned  for 9/12.  Awaiting cultures-remains on empiric vancomycin/cefepime.  COVID-19 infection: Asymptomatic-fully vaccinated.  CRP/D-dimer minimally elevated but suspect this is from surgical issues rather than COVID-19 issues.  Have asked pharmacy to see if patient qualifies for monoclonal antibody   Fever: afebrile O2 requirements:  SpO2: 100 % O2 Flow Rate (L/min): 10 L/min   COVID-19 Labs: Recent Labs    08/12/20 0250  DDIMER 0.69*  FERRITIN 111  CRP 2.4*    No results found for: BNP  No results for input(s): PROCALCITON in the last 168 hours.  Lab Results  Component Value Date  SARSCOV2NAA POSITIVE (A) 08/11/2020    Tobacco abuse: Continue transdermal nicotine   ABG:    Component Value Date/Time   TCO2 24 03/26/2020 1619    Vent Settings: N/A  Condition - Stable  Family Communication  : Patient update family himself.  Code Status :  Full Code  Diet :  Diet Order            Diet regular Room service appropriate? Yes; Fluid consistency: Thin  Diet effective now                  Disposition Plan  :   Status is: Inpatient  Remains inpatient appropriate because:Inpatient level of care appropriate due to severity of illness   Dispo: The patient is from: Home              Anticipated d/c is to: Home              Anticipated d/c date is: > 3 days              Patient currently is not medically stable to d/c.   Barriers to discharge: Hypoxia requiring O2 supplementation/complete 5 days of IV Remdesivir  Antimicorbials  :    Anti-infectives (From admission, onward)   Start     Dose/Rate Route Frequency Ordered Stop   08/11/20 1800  vancomycin (VANCOCIN) IVPB 1000 mg/200 mL premix        1,000 mg 200 mL/hr over 60 Minutes Intravenous Every 8 hours 08/11/20 0849     08/11/20 1800  ceFEPIme (MAXIPIME) 2 g in sodium chloride 0.9 % 100 mL IVPB        2 g 200 mL/hr over 30 Minutes Intravenous Every 8 hours 08/11/20 0849     08/11/20 0900  vancomycin  (VANCOREADY) IVPB 1500 mg/300 mL        1,500 mg 150 mL/hr over 120 Minutes Intravenous  Once 08/11/20 0849 08/11/20 1504   08/11/20 0900  ceFEPIme (MAXIPIME) 2 g in sodium chloride 0.9 % 100 mL IVPB        2 g 200 mL/hr over 30 Minutes Intravenous  Once 08/11/20 0849 08/11/20 1030   08/11/20 0745  clindamycin (CLEOCIN) IVPB 600 mg  Status:  Discontinued        600 mg 100 mL/hr over 30 Minutes Intravenous  Once 08/11/20 0744 08/11/20 0850      Inpatient Medications  Scheduled Meds: . vitamin C  1,000 mg Oral Daily  . nicotine  21 mg Transdermal Daily   Continuous Infusions: . sodium chloride 75 mL/hr at 08/12/20 0700  . ceFEPime (MAXIPIME) IV Stopped (08/12/20 0227)  . vancomycin Stopped (08/12/20 0353)   PRN Meds:.HYDROcodone-acetaminophen, HYDROmorphone (DILAUDID) injection, methocarbamol, ondansetron **OR** ondansetron (ZOFRAN) IV, oxyCODONE   Time Spent in minutes  25  See all Orders from today for further details   Jeoffrey Massed M.D on 08/12/2020 at 10:36 AM  To page go to [email or web address] - use universal password  [facility] -  Office  [phone]    Objective:   Vitals:   08/12/20 0000 08/12/20 0400 08/12/20 0437 08/12/20 0813  BP: 120/64 (!) 147/71 (!) 149/79 127/83  Pulse: 65 61 66 82  Resp: 16  20 17   Temp: 98.5 F (36.9 C)  98.4 F (36.9 C) 98.6 F (37 C)  TempSrc: Oral  Oral Oral  SpO2: 100% 100% 100% 100%  Weight: 77.4 kg     Height:        Wt Readings from  Last 3 Encounters:  08/12/20 77.4 kg  03/26/20 81.6 kg  02/06/19 79.4 kg     Intake/Output Summary (Last 24 hours) at 08/12/2020 1036 Last data filed at 08/12/2020 0700 Gross per 24 hour  Intake 3189.39 ml  Output 550 ml  Net 2639.39 ml     Physical Exam Gen Exam:Alert awake-not in any distress HEENT:atraumatic, normocephalic Chest: B/L clear to auscultation anteriorly CVS:S1S2 regular Abdomen:soft non tender, non distended Extremities:no edema Neurology: Non  focal Skin: no rash   Data Review:    CBC Recent Labs  Lab 08/11/20 0203 08/12/20 0250  WBC 7.8 4.3  HGB 13.3 13.3  HCT 40.2 39.7  PLT 286 223  MCV 88.2 88.2  MCH 29.2 29.6  MCHC 33.1 33.5  RDW 12.4 12.4  LYMPHSABS 1.1  --   MONOABS 0.5  --   EOSABS 0.4  --   BASOSABS 0.0  --     Chemistries  Recent Labs  Lab 08/11/20 0203 08/12/20 0250  NA 138 138  K 3.4* 3.5  CL 103 103  CO2 23 27  GLUCOSE 127* 123*  BUN 10 5*  CREATININE 0.90 0.83  CALCIUM 8.8* 8.2*  AST 22 18  ALT 20 15  ALKPHOS 80 78  BILITOT 0.3 0.5   ------------------------------------------------------------------------------------------------------------------ No results for input(s): CHOL, HDL, LDLCALC, TRIG, CHOLHDL, LDLDIRECT in the last 72 hours.  No results found for: HGBA1C ------------------------------------------------------------------------------------------------------------------ No results for input(s): TSH, T4TOTAL, T3FREE, THYROIDAB in the last 72 hours.  Invalid input(s): FREET3 ------------------------------------------------------------------------------------------------------------------ Recent Labs    08/12/20 0250  FERRITIN 111    Coagulation profile No results for input(s): INR, PROTIME in the last 168 hours.  Recent Labs    08/12/20 0250  DDIMER 0.69*    Cardiac Enzymes No results for input(s): CKMB, TROPONINI, MYOGLOBIN in the last 168 hours.  Invalid input(s): CK ------------------------------------------------------------------------------------------------------------------ No results found for: BNP  Micro Results Recent Results (from the past 240 hour(s))  SARS Coronavirus 2 by RT PCR (hospital order, performed in [facility] hospital lab) Nasopharyngeal Nasopharyngeal Swab     Status: Abnormal   Collection Time: 08/11/20  7:48 AM   Specimen: Nasopharyngeal Swab  Result Value Ref Range Status   SARS Coronavirus 2 POSITIVE (A) NEGATIVE Final     Comment: emailed L. Berdik RN 12:10 08/11/20 ([email or web address]) (NOTE) SARS-CoV-2 target nucleic acids are DETECTED  SARS-CoV-2 RNA is generally detectable in upper respiratory specimens  during the acute phase of infection.  Positive results are indicative  of the presence of the identified virus, but do not rule out bacterial infection or co-infection with other pathogens not detected by the test.  Clinical correlation with patient history and  other diagnostic information is necessary to determine patient infection status.  The expected result is negative.  Fact Sheet for Patients:   BoilerBrush.com.cy   Fact Sheet for Healthcare Providers:   https://pope.com/    This test is not yet approved or cleared by the [place] FDA and  has been authorized for detection and/or diagnosis of SARS-CoV-2 by FDA under an Emergency Use Authorization (EUA).  This EUA will remain in effect (meaning this test can be used) for the duration of  the  COVID-19 declaration under Section 564(b)(1) of the Act, 21 U.S.C. section 360-bbb-3(b)(1), unless the authorization is terminated or revoked sooner.  Performed at [facility], [address] [place], [place] [address]   Aerobic/Anaerobic Culture (surgical/deep wound)     Status: None (Preliminary result)  Collection Time: 08/11/20  7:31 PM   Specimen: PATH Soft tissue  Result Value Ref Range Status   Specimen Description ABSCESS  Final   Special Requests SOFT TISSUE SAMPLE A  Final   Gram Stain   Final    ABUNDANT WBC PRESENT, PREDOMINANTLY PMN MODERATE GRAM POSITIVE COCCI Performed at [facility], [address] [place], [place] [address]    Culture PENDING  Incomplete   Report Status PENDING  Incomplete  Aerobic/Anaerobic Culture (surgical/deep wound)     Status: None (Preliminary result)   Collection Time: 08/11/20  7:34 PM   Specimen: Bone; Tissue  Result Value Ref Range Status    Specimen Description ABSCESS  Final   Special Requests BONE SAMPLE B  Final   Gram Stain   Final    RARE WBC PRESENT, PREDOMINANTLY PMN NO ORGANISMS SEEN Performed at [facility], [address] [place], [place] [address]    Culture PENDING  Incomplete   Report Status PENDING  Incomplete    Radiology Reports DG Chest Port 1V same Day  Result Date: 08/12/2020 CLINICAL DATA:  Sob according to order, pt also confirms sob today. EXAM: PORTABLE CHEST 1 VIEW COMPARISON:  None. FINDINGS: Normal mediastinum and cardiac silhouette. Normal pulmonary vasculature. No evidence of effusion, infiltrate, or pneumothorax. No acute bony abnormality. IMPRESSION: No acute cardiopulmonary process. Electronically Signed   By: Genevive BiStewart  Edmunds M.D.   On: 08/12/2020 10:14   DG Hand Complete Left  Result Date: 08/11/2020 CLINICAL DATA:  Finger fractures.  Infected left thumb. EXAM: LEFT HAND - COMPLETE 3+ VIEW COMPARISON:  04/28/2020. FINDINGS: Extensive soft tissue swelling noted about the left thumb. Erosive changes noted about the distal aspect of the proximal phalanx and proximal aspect of distal phalanx of the thumb. These findings suggest osteomyelitis and possible septic arthritis. Associated fracture of the base of the distal phalanx of the left thumb noted. Nonunited angulated comminuted fracture of the proximal phalanx of the left second digit noted. Tiny radiopaque foreign bodies in the soft tissues adjacent to the left thumb cannot be excluded. IMPRESSION: 1. Severe soft tissue swelling left thumb. Erosive changes noted about the distal aspect of the proximal phalanx and proximal aspect of the distal phalanx of the left thumb. These findings suggest osteomyelitis and possible septic arthritis. Associated fracture of the base of the distal phalanx of the left thumb noted. Tiny radiopaque foreign bodies in the soft tissues adjacent to the left thumb cannot be excluded. 2. Nonunited angulated comminuted  fracture of the proximal phalanx of the left second digit noted Electronically Signed   By: Maisie Fushomas  Register   On: 08/11/2020 09:02   DG Finger Thumb Left  Result Date: 08/11/2020 CLINICAL DATA:  Rule out foreign body. Patient presents with wound to left thumb for 1 week. Thumb is rib swollen, red and warm to touch. Reported splinter 8 days ago. EXAM: LEFT THUMB 2+V COMPARISON:  Hand radiographs 04/28/2020 FINDINGS: Marked soft tissue swelling of the thumb. There is a minimally displaced intra-articular fracture through the lateral base of the distal phalanx. There is surrounding lucency of the distal phalanx and the adjacent head of the proximal phalanx with associated erosive change along the volar aspect, best seen on the lateral. Punctate linear densities in the soft tissues of the volar and dorsal aspect of the thumb. 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 IMPRESSION: 1. Acute mildly displaced intra-articular fracture through the lateral base  of the distal phalanx of the thumb. 2. Surrounding osseous lucency of the distal phalanx of the thumb and erosive change involving the adjacent head of the proximal phalanx, which is compatible with osteomyelitis. 3. Surrounding soft tissue swelling, compatible with cellulitis. Punctate linear densities along the volar and dorsal soft tissues of the thumb, which may represent small soft tissue foreign bodies or areas of mineralization. 4. 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 Dedicated hand radiographs could further evaluate if clinically indicated. Electronically Signed   By: Feliberto Harts MD   On: 08/11/2020 08:28

## 2020-08-13 ENCOUNTER — Inpatient Hospital Stay: Payer: Self-pay

## 2020-08-13 ENCOUNTER — Encounter (HOSPITAL_COMMUNITY): Payer: Self-pay | Admitting: Internal Medicine

## 2020-08-13 ENCOUNTER — Inpatient Hospital Stay (HOSPITAL_COMMUNITY): Payer: Self-pay | Admitting: Anesthesiology

## 2020-08-13 ENCOUNTER — Encounter (HOSPITAL_COMMUNITY)
Admission: EM | Disposition: A | Discharge status: Home Health | Payer: Self-pay | Source: Home / Self Care | Attending: Internal Medicine

## 2020-08-13 HISTORY — PX: I & D EXTREMITY: SHX5045

## 2020-08-13 LAB — BASIC METABOLIC PANEL
Anion gap: 8 (ref 5–15)
BUN: 5 mg/dL — ABNORMAL LOW (ref 6–20)
CO2: 28 mmol/L (ref 22–32)
Calcium: 8.6 mg/dL — ABNORMAL LOW (ref 8.9–10.3)
Chloride: 99 mmol/L (ref 98–111)
Creatinine, Ser: 0.87 mg/dL (ref 0.61–1.24)
GFR calc Af Amer: >60 mL/min (ref >60)
GFR calc non Af Amer: >60 mL/min (ref >60)
Glucose, Bld: 116 mg/dL — ABNORMAL HIGH (ref 70–99)
Potassium: 3.6 mmol/L (ref 3.5–5.1)
Sodium: 135 mmol/L (ref 135–145)

## 2020-08-13 LAB — CBC
HCT: 43.8 % (ref 39.0–52.0)
Hemoglobin: 14.6 g/dL (ref 13.0–17.0)
MCH: 28.9 pg (ref 26.0–34.0)
MCHC: 33.3 g/dL (ref 30.0–36.0)
MCV: 86.7 fL (ref 80.0–100.0)
Platelets: 203 10*3/uL (ref 150–400)
RBC: 5.05 MIL/uL (ref 4.22–5.81)
RDW: 12.3 % (ref 11.5–15.5)
WBC: 5.3 10*3/uL (ref 4.0–10.5)
nRBC: 0 % (ref 0.0–0.2)

## 2020-08-13 LAB — HEPATITIS PANEL, ACUTE
HCV Ab: NONREACTIVE
Hep A IgM: NONREACTIVE
Hep B C IgM: NONREACTIVE
Hepatitis B Surface Ag: NONREACTIVE

## 2020-08-13 SURGERY — IRRIGATION AND DEBRIDEMENT EXTREMITY
Anesthesia: General | Laterality: Left

## 2020-08-13 MED ORDER — FENTANYL CITRATE (PF) 250 MCG/5ML IJ SOLN
INTRAMUSCULAR | Status: AC
  Filled 2020-08-13: qty 5

## 2020-08-13 MED ORDER — FENTANYL CITRATE (PF) 100 MCG/2ML IJ SOLN: 25.0000 ug | INTRAMUSCULAR | Status: DC | PRN

## 2020-08-13 MED ORDER — MIDAZOLAM HCL 2 MG/2ML IJ SOLN
INTRAMUSCULAR | Status: AC
  Filled 2020-08-13: qty 2

## 2020-08-13 MED ORDER — CEFAZOLIN SODIUM-DEXTROSE 2-4 GM/100ML-% IV SOLN
2.0000 g | Freq: Three times a day (TID) | INTRAVENOUS | Status: DC
  Administered 2020-08-13 – 2020-08-14 (×3): 2 g via INTRAVENOUS
  Filled 2020-08-13 (×5): qty 100

## 2020-08-13 MED ORDER — OXYCODONE HCL 5 MG PO TABS: 5.0000 mg | ORAL_TABLET | Freq: Once | ORAL | Status: DC | PRN

## 2020-08-13 MED ORDER — SODIUM CHLORIDE 0.9 % IR SOLN
Status: DC | PRN
  Administered 2020-08-13: 3000 mL

## 2020-08-13 MED ORDER — SUCCINYLCHOLINE CHLORIDE 200 MG/10ML IV SOSY
PREFILLED_SYRINGE | INTRAVENOUS | Status: DC | PRN
  Administered 2020-08-13: 100 mg via INTRAVENOUS

## 2020-08-13 MED ORDER — SUCCINYLCHOLINE CHLORIDE 200 MG/10ML IV SOSY
PREFILLED_SYRINGE | INTRAVENOUS | Status: AC
  Filled 2020-08-13: qty 10

## 2020-08-13 MED ORDER — PROPOFOL 10 MG/ML IV BOLUS
INTRAVENOUS | Status: AC
  Filled 2020-08-13: qty 20

## 2020-08-13 MED ORDER — ONDANSETRON HCL 4 MG/2ML IJ SOLN
INTRAMUSCULAR | Status: DC | PRN
  Administered 2020-08-13: 4 mg via INTRAVENOUS

## 2020-08-13 MED ORDER — LIDOCAINE 2% (20 MG/ML) 5 ML SYRINGE
INTRAMUSCULAR | Status: AC
  Filled 2020-08-13: qty 5

## 2020-08-13 MED ORDER — AMISULPRIDE (ANTIEMETIC) 5 MG/2ML IV SOLN: 10.0000 mg | Freq: Once | INTRAVENOUS | Status: DC | PRN

## 2020-08-13 MED ORDER — LIDOCAINE 2% (20 MG/ML) 5 ML SYRINGE
INTRAMUSCULAR | Status: DC | PRN
  Administered 2020-08-13: 50 mg via INTRAVENOUS

## 2020-08-13 MED ORDER — PROPOFOL 10 MG/ML IV BOLUS
INTRAVENOUS | Status: DC | PRN
  Administered 2020-08-13: 150 mg via INTRAVENOUS

## 2020-08-13 MED ORDER — ONDANSETRON HCL 4 MG/2ML IJ SOLN
INTRAMUSCULAR | Status: AC
  Filled 2020-08-13: qty 2

## 2020-08-13 MED ORDER — DEXAMETHASONE SODIUM PHOSPHATE 10 MG/ML IJ SOLN
INTRAMUSCULAR | Status: AC
  Filled 2020-08-13: qty 1

## 2020-08-13 MED ORDER — LACTATED RINGERS IV SOLN: INTRAVENOUS | Status: DC | PRN

## 2020-08-13 MED ORDER — MIDAZOLAM HCL 2 MG/2ML IJ SOLN
INTRAMUSCULAR | Status: DC | PRN
  Administered 2020-08-13: 2 mg via INTRAVENOUS

## 2020-08-13 MED ORDER — TETANUS-DIPHTH-ACELL PERTUSSIS 5-2.5-18.5 LF-MCG/0.5 IM SUSP
0.5000 mL | Freq: Once | INTRAMUSCULAR | Status: AC
  Administered 2020-08-13: 0.5 mL via INTRAMUSCULAR
  Filled 2020-08-13 (×2): qty 0.5

## 2020-08-13 MED ORDER — KETOROLAC TROMETHAMINE 30 MG/ML IJ SOLN: 30.0000 mg | Freq: Once | INTRAMUSCULAR | Status: DC | PRN

## 2020-08-13 MED ORDER — DEXAMETHASONE SODIUM PHOSPHATE 10 MG/ML IJ SOLN
INTRAMUSCULAR | Status: DC | PRN
  Administered 2020-08-13: 10 mg via INTRAVENOUS

## 2020-08-13 MED ORDER — OXYCODONE HCL 5 MG/5ML PO SOLN: 5.0000 mg | Freq: Once | ORAL | Status: DC | PRN

## 2020-08-13 MED ORDER — FENTANYL CITRATE (PF) 250 MCG/5ML IJ SOLN
INTRAMUSCULAR | Status: DC | PRN
Start: 2020-08-13 — End: 2020-08-13
  Administered 2020-08-13 (×3): 50 ug via INTRAVENOUS
  Administered 2020-08-13: 100 ug via INTRAVENOUS

## 2020-08-13 SURGICAL SUPPLY — 34 items
BNDG CONFORM 2 STRL LF (GAUZE/BANDAGES/DRESSINGS) ×4 IMPLANT
BNDG ELASTIC 4X5.8 VLCR STR LF (GAUZE/BANDAGES/DRESSINGS) ×3 IMPLANT
BNDG GAUZE ELAST 4 BULKY (GAUZE/BANDAGES/DRESSINGS) ×5 IMPLANT
CORD BIPOLAR FORCEPS 12FT (ELECTRODE) ×3 IMPLANT
COVER SURGICAL LIGHT HANDLE (MISCELLANEOUS) ×3 IMPLANT
CUFF TOURN SGL QUICK 18X4 (TOURNIQUET CUFF) ×3 IMPLANT
DRSG ADAPTIC 3X8 NADH LF (GAUZE/BANDAGES/DRESSINGS) ×3 IMPLANT
GAUZE SPONGE 4X4 12PLY STRL (GAUZE/BANDAGES/DRESSINGS) ×3 IMPLANT
GAUZE XEROFORM 1X8 LF (GAUZE/BANDAGES/DRESSINGS) ×3 IMPLANT
GAUZE XEROFORM 5X9 LF (GAUZE/BANDAGES/DRESSINGS) ×2 IMPLANT
GLOVE SS BIOGEL STRL SZ 8 (GLOVE) ×1 IMPLANT
GLOVE SUPERSENSE BIOGEL SZ 8 (GLOVE) ×2
GOWN STRL REUS W/ TWL LRG LVL3 (GOWN DISPOSABLE) ×1 IMPLANT
GOWN STRL REUS W/ TWL XL LVL3 (GOWN DISPOSABLE) ×2 IMPLANT
GOWN STRL REUS W/TWL LRG LVL3 (GOWN DISPOSABLE) ×3
GOWN STRL REUS W/TWL XL LVL3 (GOWN DISPOSABLE) ×6
KIT BASIN OR (CUSTOM PROCEDURE TRAY) ×3 IMPLANT
KIT TURNOVER KIT B (KITS) ×3 IMPLANT
LOOP VESSEL MAXI BLUE (MISCELLANEOUS) ×2 IMPLANT
MANIFOLD NEPTUNE II (INSTRUMENTS) ×3 IMPLANT
NS IRRIG 1000ML POUR BTL (IV SOLUTION) ×3 IMPLANT
PACK ORTHO EXTREMITY (CUSTOM PROCEDURE TRAY) ×3 IMPLANT
PAD ARMBOARD 7.5X6 YLW CONV (MISCELLANEOUS) ×3 IMPLANT
PAD CAST 4YDX4 CTTN HI CHSV (CAST SUPPLIES) ×1 IMPLANT
PADDING CAST COTTON 4X4 STRL (CAST SUPPLIES) ×3
SET CYSTO W/LG BORE CLAMP LF (SET/KITS/TRAYS/PACK) ×3 IMPLANT
SOL PREP POV-IOD 4OZ 10% (MISCELLANEOUS) ×6 IMPLANT
SUT PROLENE 4 0 PS 2 18 (SUTURE) ×4 IMPLANT
TOWEL GREEN STERILE (TOWEL DISPOSABLE) ×3 IMPLANT
TOWEL GREEN STERILE FF (TOWEL DISPOSABLE) ×3 IMPLANT
TUBE CONNECTING 12'X1/4 (SUCTIONS) ×1
TUBE CONNECTING 12X1/4 (SUCTIONS) ×2 IMPLANT
WATER STERILE IRR 1000ML POUR (IV SOLUTION) ×3 IMPLANT
YANKAUER SUCT BULB TIP NO VENT (SUCTIONS) ×3 IMPLANT

## 2020-08-13 NOTE — Progress Notes (Signed)
PROGRESS NOTE                                                                                                                                                                                                             Patient Demographics:    Kenneth Faulkner, is a 44 y.o. male, DOB - 1975/12/24, XBJ:[id number]  Outpatient Primary MD for the patient is Patient, [name]   Admit date - 08/11/2020   LOS - 2  Chief Complaint  Patient presents with   Hand Pain       Brief Narrative: Patient is a 44 y.o. male with no past medical history-who had a wood splinter puncture to his left thumb-subsequently had pain and swelling for approximately 10-12 days before seeking medical attention in the emergency room on 9/10-found to have soft tissue infection with underlying osteomyelitis and incidental COVID-19 infection.  Admitted to the hospitalist service for further evaluation and treatment.  COVID-19 vaccinated status: Vaccinated  Significant Events: 9/10>> Admit to [facility] for left thumb cellulitis with soft tissue infection-incidental COVID-19 infection  Significant studies: 9/11>>Chest x-ray: No acute cardiopulmonary process 9/12>>Operative procedure #1 irrigation debridement and bony curettage left thumb distal phalanx and proximal phalanx at the IP joint which has been rendered incompetent due to osteomyelitis.  Patient has complete joint destruction.  #2 flexor tenolysis tenosynovectomy secondary to infectious flexor tenosynovitis #3 extensor tenolysis tendon synovectomy and partial tenotomy secondary to chronic extensor infectious tenosynovitis   COVID-19 medications: None  Antibiotics: Vancomycin: 9/10>> Cefepime: 9/10>>  Microbiology data: 9/19>> wound culture/intraoperative culture: Pending  Procedures: 9/10>> irrigation and debridement abscess left thumb, bony debridement and resection of destroyed  DIP  Consults: None  DVT prophylaxis: enoxaparin (LOVENOX) injection 40 mg Start: 08/12/20 1100 SCDs Start: 08/11/20 1756  Prophylactic Lovenox    Subjective:    Kenneth DkWesley Kell today reports good night sleep, he denies any complaints, no cough, no shortness of breath.      Assessment  & Plan :   Acute Left thumb osteomyelitis:  -Postop care deferred to hand surgery -No further washout and debridement currently required, -Follow on intraoperative cultures. -ID has been consulted by hand surgery for further antibiotic recommendations, meanwhile continue with vancomycin and cefepime.  COVID-19 infection: Asymptomatic-fully vaccinated with J&J vaccine couple months ago.  CRP/D-dimer minimally elevated but suspect this is from surgical  issues rather than COVID-19 issues.  -Received monoclonal antibody 9/12.   COVID-19 Labs: Recent Labs    08/12/20 0250  DDIMER 0.69*  FERRITIN 111  CRP 2.4*    No results found for: BNP  No results for input(s): PROCALCITON in the last 168 hours.  Lab Results  Component Value Date   SARSCOV2NAA POSITIVE (A) 08/11/2020    Tobacco abuse: Continue transdermal nicotine   ABG:    Component Value Date/Time   TCO2 24 03/26/2020 1619    Vent Settings: N/A  Condition - Stable  Family Communication  : Patient update family himself.  Code Status :  Full Code  Diet :  Diet Order            Diet NPO time specified  Diet effective midnight                  Disposition Plan  :   Status is: Inpatient  Remains inpatient appropriate because:Inpatient level of care appropriate due to severity of illness   Dispo: The patient is from: Home              Anticipated d/c is to: Home              Anticipated d/c date is: 2 days              Patient currently is not medically stable to d/c.   Barriers to discharge: Hypoxia requiring O2 supplementation/complete 5 days of IV Remdesivir  Antimicorbials  :    Anti-infectives (From  admission, onward)   Start     Dose/Rate Route Frequency Ordered Stop   08/11/20 1800  [MAR Hold]  vancomycin (VANCOCIN) IVPB 1000 mg/200 mL premix        (MAR Hold since Sun 08/13/2020 at 1142.Hold Reason: Transfer to a Procedural area.)   1,000 mg 200 mL/hr over 60 Minutes Intravenous Every 8 hours 08/11/20 0849     08/11/20 1800  [MAR Hold]  ceFEPIme (MAXIPIME) 2 g in sodium chloride 0.9 % 100 mL IVPB        (MAR Hold since Sun 08/13/2020 at 1142.Hold Reason: Transfer to a Procedural area.)   2 g 200 mL/hr over 30 Minutes Intravenous Every 8 hours 08/11/20 0849     08/11/20 0900  vancomycin (VANCOREADY) IVPB 1500 mg/300 mL        1,500 mg 150 mL/hr over 120 Minutes Intravenous  Once 08/11/20 0849 08/11/20 1504   08/11/20 0900  ceFEPIme (MAXIPIME) 2 g in sodium chloride 0.9 % 100 mL IVPB        2 g 200 mL/hr over 30 Minutes Intravenous  Once 08/11/20 0849 08/11/20 1030   08/11/20 0745  clindamycin (CLEOCIN) IVPB 600 mg  Status:  Discontinued        600 mg 100 mL/hr over 30 Minutes Intravenous  Once 08/11/20 0744 08/11/20 0850      Inpatient Medications  Scheduled Meds:  [MAR Hold] vitamin C  1,000 mg Oral Daily   [MAR Hold] enoxaparin (LOVENOX) injection  40 mg Subcutaneous Q24H   [MAR Hold] nicotine  21 mg Transdermal Daily   Continuous Infusions:  sodium chloride Stopped (08/12/20 1732)   [MAR Hold] sodium chloride     [MAR Hold] ceFEPime (MAXIPIME) IV 2 g (08/13/20 0854)   [MAR Hold] famotidine (PEPCID) IV     [MAR Hold] vancomycin 1,000 mg (08/13/20 0956)   PRN Meds:.[MAR Hold] sodium chloride, [MAR Hold] albuterol, amisulpride, amisulpride, [MAR Hold] diphenhydrAMINE, [MAR Hold]  EPINEPHrine, [MAR Hold] famotidine (PEPCID) IV, fentaNYL (SUBLIMAZE) injection, fentaNYL (SUBLIMAZE) injection, [MAR Hold] HYDROcodone-acetaminophen, [MAR Hold]  HYDROmorphone (DILAUDID) injection, ketorolac, [MAR Hold] methocarbamol, [MAR Hold] methylPREDNISolone (SOLU-MEDROL) injection, [MAR  Hold] ondansetron **OR** [MAR Hold] ondansetron (ZOFRAN) IV, [MAR Hold] oxyCODONE, oxyCODONE **OR** oxyCODONE   Time Spent in minutes  25  See all Orders from today for further details   Huey Bienenstock M.D on 08/13/2020 at 12:40 PM  To page go to [email or web address] - use universal password  [facility] -  Office  [phone]    Objective:   Vitals:   08/13/20 0509 08/13/20 0900 08/13/20 1220 08/13/20 1235  BP: 137/81  (!) 143/85 136/87  Pulse: 81  63 66  Resp: 16  18 19   Temp: 97.7 F (36.5 C)  98.9 F (37.2 C) 98.9 F (37.2 C)  TempSrc: Oral     SpO2: 99% 100% 100% 98%  Weight:      Height:        Wt Readings from Last 3 Encounters:  08/12/20 77.4 kg  03/26/20 81.6 kg  02/06/19 79.4 kg     Intake/Output Summary (Last 24 hours) at 08/13/2020 1240 Last data filed at 08/13/2020 1233 Gross per 24 hour  Intake 2207.43 ml  Output 1600 ml  Net 607.43 ml     Physical Exam  Awake Alert, Oriented X 3, No new F.N deficits, Normal affect Symmetrical Chest wall movement, Good air movement bilaterally, CTAB RRR,No Gallops,Rubs or new Murmurs, No Parasternal Heave +ve B.Sounds, Abd Soft, No tenderness, No rebound - guarding or rigidity. No Cyanosis, Clubbing or edema, left arm and wrist bandaged with Ace wrap.   Data Review:    CBC Recent Labs  Lab 08/11/20 0203 08/12/20 0250 08/13/20 0500  WBC 7.8 4.3 5.3  HGB 13.3 13.3 14.6  HCT 40.2 39.7 43.8  PLT 286 223 203  MCV 88.2 88.2 86.7  MCH 29.2 29.6 28.9  MCHC 33.1 33.5 33.3  RDW 12.4 12.4 12.3  LYMPHSABS 1.1  --   --   MONOABS 0.5  --   --   EOSABS 0.4  --   --   BASOSABS 0.0  --   --     Chemistries  Recent Labs  Lab 08/11/20 0203 08/12/20 0250 08/13/20 0500  NA 138 138 135  K 3.4* 3.5 3.6  CL 103 103 99  CO2 23 27 28   GLUCOSE 127* 123* 116*  BUN 10 5* 5*  CREATININE 0.90 0.83 0.87  CALCIUM 8.8* 8.2* 8.6*  AST 22 18  --   ALT 20 15  --   ALKPHOS 80 78  --   BILITOT 0.3 0.5  --     ------------------------------------------------------------------------------------------------------------------ No results for input(s): CHOL, HDL, LDLCALC, TRIG, CHOLHDL, LDLDIRECT in the last 72 hours.  No results found for: HGBA1C ------------------------------------------------------------------------------------------------------------------ No results for input(s): TSH, T4TOTAL, T3FREE, THYROIDAB in the last 72 hours.  Invalid input(s): FREET3 ------------------------------------------------------------------------------------------------------------------ Recent Labs    08/12/20 0250  FERRITIN 111    Coagulation profile No results for input(s): INR, PROTIME in the last 168 hours.  Recent Labs    08/12/20 0250  DDIMER 0.69*    Cardiac Enzymes No results for input(s): CKMB, TROPONINI, MYOGLOBIN in the last 168 hours.  Invalid input(s): CK ------------------------------------------------------------------------------------------------------------------ No results found for: BNP  Micro Results Recent Results (from the past 240 hour(s))  SARS Coronavirus 2 by RT PCR (hospital order, performed in [facility] hospital lab) Nasopharyngeal Nasopharyngeal Swab     Status: Abnormal  Collection Time: 08/11/20  7:48 AM   Specimen: Nasopharyngeal Swab  Result Value Ref Range Status   SARS Coronavirus 2 POSITIVE (A) NEGATIVE Final    Comment: emailed L. Berdik RN 12:10 08/11/20 ([email or web address]) (NOTE) SARS-CoV-2 target nucleic acids are DETECTED  SARS-CoV-2 RNA is generally detectable in upper respiratory specimens  during the acute phase of infection.  Positive results are indicative  of the presence of the identified virus, but do not rule out bacterial infection or co-infection with other pathogens not detected by the test.  Clinical correlation with patient history and  other diagnostic information is necessary to determine patient infection status.  The expected result is  negative.  Fact Sheet for Patients:   BoilerBrush.com.cy   Fact Sheet for Healthcare Providers:   https://pope.com/    This test is not yet approved or cleared by the [place] FDA and  has been authorized for detection and/or diagnosis of SARS-CoV-2 by FDA under an Emergency Use Authorization (EUA).  This EUA will remain in effect (meaning this test can be used) for the duration of  the  COVID-19 declaration under Section 564(b)(1) of the Act, 21 U.S.C. section 360-bbb-3(b)(1), unless the authorization is terminated or revoked sooner.  Performed at [facility], [address] [place], [place] [address]   Aerobic/Anaerobic Culture (surgical/deep wound)     Status: None (Preliminary result)   Collection Time: 08/11/20  7:31 PM   Specimen: PATH Soft tissue  Result Value Ref Range Status   Specimen Description ABSCESS  Final   Special Requests SOFT TISSUE SAMPLE A  Final   Gram Stain   Final    ABUNDANT WBC PRESENT, PREDOMINANTLY PMN MODERATE GRAM POSITIVE COCCI Performed at [facility], [address], [place], [place] [address]    Culture FEW STAPHYLOCOCCUS AUREUS  Final   Report Status PENDING  Incomplete  Aerobic/Anaerobic Culture (surgical/deep wound)     Status: None (Preliminary result)   Collection Time: 08/11/20  7:34 PM   Specimen: Bone; Tissue  Result Value Ref Range Status   Specimen Description ABSCESS  Final   Special Requests BONE SAMPLE B  Final   Gram Stain   Final    RARE WBC PRESENT, PREDOMINANTLY PMN NO ORGANISMS SEEN Performed at [facility], [address] [place], [place] [address]    Culture RARE STAPHYLOCOCCUS AUREUS  Final   Report Status PENDING  Incomplete    Radiology Reports DG Chest Port 1V same Day  Result Date: 08/12/2020 CLINICAL DATA:  Sob according to order, pt also confirms sob today. EXAM: PORTABLE CHEST 1 VIEW COMPARISON:  None. FINDINGS: Normal mediastinum  and cardiac silhouette. Normal pulmonary vasculature. No evidence of effusion, infiltrate, or pneumothorax. No acute bony abnormality. IMPRESSION: No acute cardiopulmonary process. Electronically Signed   By: Genevive Bi M.D.   On: 08/12/2020 10:14   DG Hand Complete Left  Result Date: 08/11/2020 CLINICAL DATA:  Finger fractures.  Infected left thumb. EXAM: LEFT HAND - COMPLETE 3+ VIEW COMPARISON:  04/28/2020. FINDINGS: Extensive soft tissue swelling noted about the left thumb. Erosive changes noted about the distal aspect of the proximal phalanx and proximal aspect of distal phalanx of the thumb. These findings suggest osteomyelitis and possible septic arthritis. Associated fracture of the base of the distal phalanx of the left thumb noted. Nonunited angulated comminuted fracture of the proximal phalanx of the left second digit noted. Tiny radiopaque foreign bodies in the soft tissues adjacent to the left thumb  cannot be excluded. IMPRESSION: 1. Severe soft tissue swelling left thumb. Erosive changes noted about the distal aspect of the proximal phalanx and proximal aspect of the distal phalanx of the left thumb. These findings suggest osteomyelitis and possible septic arthritis. Associated fracture of the base of the distal phalanx of the left thumb noted. Tiny radiopaque foreign bodies in the soft tissues adjacent to the left thumb cannot be excluded. 2. Nonunited angulated comminuted fracture of the proximal phalanx of the left second digit noted Electronically Signed   By: Maisie Fus  Register   On: 08/11/2020 09:02   DG Finger Thumb Left  Result Date: 08/11/2020 CLINICAL DATA:  Rule out foreign body. Patient presents with wound to left thumb for 1 week. Thumb is rib swollen, red and warm to touch. Reported splinter 8 days ago. EXAM: LEFT THUMB 2+V COMPARISON:  Hand radiographs 04/28/2020 FINDINGS: Marked soft tissue swelling of the thumb. There is a minimally displaced intra-articular fracture through  the lateral base of the distal phalanx. There is surrounding lucency of the distal phalanx and the adjacent head of the proximal phalanx with associated erosive change along the volar aspect, best seen on the lateral. Punctate linear densities in the soft tissues of the volar and dorsal aspect of the thumb. 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 IMPRESSION: 1. Acute mildly displaced intra-articular fracture through the lateral base of the distal phalanx of the thumb. 2. Surrounding osseous lucency of the distal phalanx of the thumb and erosive change involving the adjacent head of the proximal phalanx, which is compatible with osteomyelitis. 3. Surrounding soft tissue swelling, compatible with cellulitis. Punctate linear densities along the volar and dorsal soft tissues of the thumb, which may represent small soft tissue foreign bodies or areas of mineralization. 4. 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 Dedicated hand radiographs could further evaluate if clinically indicated. Electronically Signed   By: Feliberto Harts MD   On: 08/11/2020 08:28

## 2020-08-13 NOTE — Transfer of Care (Signed)
Immediate Anesthesia Transfer of Care Note  Patient: Kenneth Faulkner  Procedure(s) Performed: IRRIGATION AND DEBRIDEMENT LEFT THUMB (Left )  Patient Location: PACU and OR 21 Recovery d/t COVID  Anesthesia Type:General  Level of Consciousness: awake, patient cooperative and responds to stimulation  Airway & Oxygen Therapy: Patient Spontanous Breathing and Patient connected to face mask oxygen  Post-op Assessment: Report given to RN and Post -op Vital signs reviewed and stable  Post vital signs: Reviewed and stable  Last Vitals:  Vitals Value Taken Time  BP    Temp    Pulse    Resp    SpO2      Last Pain:  Vitals:   08/13/20 0900  TempSrc:   PainSc: 0-No pain      Patients Stated Pain Goal: 2 (08/11/20 2100)  Complications: No complications documented.

## 2020-08-13 NOTE — Anesthesia Preprocedure Evaluation (Signed)
Anesthesia Evaluation  Patient identified by MRN, date of birth, ID band Patient awake    Reviewed: Allergy & Precautions, NPO status , Patient's Chart, lab work & pertinent test results  Airway Mallampati: II  TM Distance: >3 FB Neck ROM: Full    Dental  (+) Edentulous Upper, Dental Advisory Given   Pulmonary Current Smoker,  COVID +   breath sounds clear to auscultation       Cardiovascular negative cardio ROS   Rhythm:Regular Rate:Normal     Neuro/Psych negative neurological ROS     GI/Hepatic negative GI ROS, (+)     substance abuse  marijuana use,   Endo/Other  negative endocrine ROS  Renal/GU negative Renal ROS     Musculoskeletal negative musculoskeletal ROS (+)   Abdominal   Peds  Hematology negative hematology ROS (+)   Anesthesia Other Findings Left thumb infection   Reproductive/Obstetrics                            Anesthesia Physical Anesthesia Plan  ASA: II  Anesthesia Plan: General   Post-op Pain Management:    Induction: Intravenous and Rapid sequence  PONV Risk Score and Plan: 2 and Ondansetron, Midazolam and Dexamethasone  Airway Management Planned: Oral ETT  Additional Equipment: None  Intra-op Plan:   Post-operative Plan: Extubation in OR  Informed Consent: I have reviewed the patients History and Physical, chart, labs and discussed the procedure including the risks, benefits and alternatives for the proposed anesthesia with the patient or authorized representative who has indicated his/her understanding and acceptance.     Dental advisory given  Plan Discussed with: CRNA  Anesthesia Plan Comments:        Anesthesia Quick Evaluation  

## 2020-08-13 NOTE — Progress Notes (Signed)
Patient ID: Kenneth Faulkner, male   DOB: Mar 17, 1976, 44 y.o.   MRN: [id number] Please see operative note.  Patient's wound condition were improved.  We performed aggressive debridement again today.  The patient tolerated this well.  This will be his final washout.  We will plan for dressing changes periodically and a small amount of secondary intention closure.  Given the wound conditions and improvement there is no need for further debridement unless conditions deteriorate.  We will await culture results and tailor treatment towards long-term antibiotics given the osteomyelitic focus.  I discussed his care with Dr. Johny Sax of the telephone to initiate the infectious disease consult.  I discussed all issues with patient in detail of course.  He would like to attempt salvage to the thumb if at all possible.  Arihaan Bellucci MD

## 2020-08-13 NOTE — Consult Note (Signed)
[facility]    Date of Admission:  08/11/2020   Total days of antibiotics: 2 vanco/cefepime               Reason for Consult: L thumb osteomyelitis    Referring Provider: Gramig   Assessment: L thumb osteomyelitis Cx- S aureus COVID+  Plan: 1. Change anbx to ancef 2. He denies allergies 3. Tdap 4. Place PIC 5. Plan for long term IV anbx 6. Check hepatitis panel, HIV(-) 7. He has received mAb for COVID.  8. opat orders placed  Thank you so much for this interesting consult,  Principal Problem:   Osteomyelitis (HCC), left thumb Active Problems:   Wound cellulitis   Nicotine use disorder   . vitamin C  1,000 mg Oral Daily  . enoxaparin (LOVENOX) injection  40 mg Subcutaneous Q24H  . nicotine  21 mg Transdermal Daily    HPI: Kenneth Faulkner is a 44 y.o. male with no pmhx admits to getting a wood splinter in his L 1st digit ! 2 weeks ago (he is unable to remember the date).  He denies f/c or proximal erythema. He was taken to OR 9-12 and underwent debridement and was found to have complete joint destruction. His Cx is now showing S aureus.  He believes he had a tetanus shot in the last 5 years.  He has a dog but denies that he had injury from this.  On adm he was found to be COVID +. He is asx. His wife is also COVID+  Review of Systems: Review of Systems  Constitutional: Negative for chills and fever.  Respiratory: Negative for cough and shortness of breath.   Gastrointestinal: Negative for constipation and diarrhea.  Genitourinary: Negative for dysuria.  he has lost his sense of taste.  Please see HPI. All other systems reviewed and negative.  History reviewed. No pertinent past medical history.  Social History   Tobacco Use  . Smoking status: Current Every Day Smoker  . Smokeless tobacco: Never Used  Substance Use Topics  . Alcohol use: Yes  . Drug use: Yes    Types: Marijuana    History reviewed. No  pertinent family history.   Medications:  Scheduled: . vitamin C  1,000 mg Oral Daily  . enoxaparin (LOVENOX) injection  40 mg Subcutaneous Q24H  . nicotine  21 mg Transdermal Daily    Abtx:  Anti-infectives (From admission, onward)   Start     Dose/Rate Route Frequency Ordered Stop   08/11/20 1800  vancomycin (VANCOCIN) IVPB 1000 mg/200 mL premix        1,000 mg 200 mL/hr over 60 Minutes Intravenous Every 8 hours 08/11/20 0849     08/11/20 1800  ceFEPIme (MAXIPIME) 2 g in sodium chloride 0.9 % 100 mL IVPB        2 g 200 mL/hr over 30 Minutes Intravenous Every 8 hours 08/11/20 0849     08/11/20 0900  vancomycin (VANCOREADY) IVPB 1500 mg/300 mL        1,500 mg 150 mL/hr over 120 Minutes Intravenous  Once 08/11/20 0849 08/11/20 1504   08/11/20 0900  ceFEPIme (MAXIPIME) 2 g in sodium chloride 0.9 % 100 mL IVPB        2 g 200 mL/hr over 30 Minutes Intravenous  Once 08/11/20 0849 08/11/20 1030   08/11/20 0745  clindamycin (CLEOCIN) IVPB 600 mg  Status:  Discontinued  600 mg 100 mL/hr over 30 Minutes Intravenous  Once 08/11/20 0744 08/11/20 0850        OBJECTIVE: Blood pressure 136/87, pulse 66, temperature 98.9 F (37.2 C), resp. rate 19, height  (1.702 m), weight 77.4 kg, SpO2 98 %.  Physical Exam Vitals reviewed.  Constitutional:      General: He is not in acute distress.    Appearance: Normal appearance. He is not ill-appearing.  HENT:     Mouth/Throat:     Mouth: Mucous membranes are moist.     Pharynx: Posterior oropharyngeal erythema present. No oropharyngeal exudate.  Eyes:     Extraocular Movements: Extraocular movements intact.     Pupils: Pupils are equal, round, and reactive to light.  Cardiovascular:     Rate and Rhythm: Normal rate and regular rhythm.  Pulmonary:     Effort: Pulmonary effort is normal.     Breath sounds: Normal breath sounds.  Abdominal:     General: Bowel sounds are normal. There is no distension.     Palpations: Abdomen is  soft.     Tenderness: There is no abdominal tenderness.  Musculoskeletal:     Cervical back: Normal range of motion and neck supple.     Right lower leg: No edema.     Left lower leg: No edema.     Comments: L upper extrem wrapped at mid-forearm to finger tips. There is no proximal erythema or swelling, nor tenderness.   Lymphadenopathy:     Upper Body:     Left upper body: No axillary adenopathy.  Neurological:     General: No focal deficit present.     Mental Status: He is alert.  Psychiatric:        Mood and Affect: Mood normal.     Lab Results Results for orders placed or performed during the hospital encounter of 08/11/20 (from the past 48 hour(s))  Aerobic/Anaerobic Culture (surgical/deep wound)     Status: None (Preliminary result)   Collection Time: 08/11/20  7:31 PM   Specimen: PATH Soft tissue  Result Value Ref Range   Specimen Description ABSCESS    Special Requests SOFT TISSUE SAMPLE A    Gram Stain      ABUNDANT WBC PRESENT, PREDOMINANTLY PMN MODERATE GRAM POSITIVE COCCI Performed at [facility], [address], [place], [place] [address]    Culture FEW STAPHYLOCOCCUS AUREUS    Report Status PENDING   Aerobic/Anaerobic Culture (surgical/deep wound)     Status: None (Preliminary result)   Collection Time: 08/11/20  7:34 PM   Specimen: Bone; Tissue  Result Value Ref Range   Specimen Description ABSCESS    Special Requests BONE SAMPLE B    Gram Stain      RARE WBC PRESENT, PREDOMINANTLY PMN NO ORGANISMS SEEN Performed at [facility], [address], [place], [place] [address]    Culture RARE STAPHYLOCOCCUS AUREUS    Report Status PENDING   HIV Antibody (routine testing w rflx)     Status: None   Collection Time: 08/12/20  2:50 AM  Result Value Ref Range   HIV Screen 4th Generation wRfx Non Reactive Non Reactive    Comment: Performed at [facility], [address] [place], [place] [address]  Basic metabolic panel     Status: Abnormal     Collection Time: 08/12/20  2:50 AM  Result Value Ref Range   Sodium 138 135 - 145 mmol/L   Potassium 3.5 3.5 -  5.1 mmol/L   Chloride 103 98 - 111 mmol/L   CO2 27 22 - 32 mmol/L   Glucose, Bld 123 (H) 70 - 99 mg/dL    Comment: Glucose reference range applies only to samples taken after fasting for at least 8 hours.   BUN 5 (L) 6 - 20 mg/dL   Creatinine, Ser 7.82 0.61 - 1.24 mg/dL   Calcium 8.2 (L) 8.9 - 10.3 mg/dL   GFR calc non Af Amer >60 >60 mL/min   GFR calc Af Amer >60 >60 mL/min   Anion gap 8 5 - 15    Comment: Performed at [facility], [address] [place], [place] [address]  CBC     Status: None   Collection Time: 08/12/20  2:50 AM  Result Value Ref Range   WBC 4.3 4.0 - 10.5 K/uL   RBC 4.50 4.22 - 5.81 MIL/uL   Hemoglobin 13.3 13.0 - 17.0 g/dL   HCT 30.8 39 - 52 %   MCV 88.2 80.0 - 100.0 fL   MCH 29.6 26.0 - 34.0 pg   MCHC 33.5 30.0 - 36.0 g/dL   RDW 65.7 84.6 - 96.2 %   Platelets 223 150 - 400 K/uL   nRBC 0.0 0.0 - 0.2 %    Comment: Performed at [facility], [address] [place], [place] [address]  C-reactive protein     Status: Abnormal   Collection Time: 08/12/20  2:50 AM  Result Value Ref Range   CRP 2.4 (H) <1.0 mg/dL    Comment: Performed at [facility], [address] [place], [place] [address]  Ferritin     Status: None   Collection Time: 08/12/20  2:50 AM  Result Value Ref Range   Ferritin 111 24 - 336 ng/mL    Comment: Performed at [facility], [address], [place], [place] [address]  D-dimer, quantitative (not at [facility])     Status: Abnormal   Collection Time: 08/12/20  2:50 AM  Result Value Ref Range   D-Dimer, Quant 0.69 (H) 0.00 - 0.50 ug/mL-FEU    Comment: (NOTE) At the manufacturer cut-off of 0.50 ug/mL FEU, this assay has been documented to exclude PE with a sensitivity and negative predictive value of 97 to 99%.  At this time, this assay has not been approved by the FDA to exclude DVT/VTE. Results should  be correlated with clinical presentation. Performed at [facility], [address], [place], [place] [address]   Hepatic function panel     Status: Abnormal   Collection Time: 08/12/20  2:50 AM  Result Value Ref Range   Total Protein 6.0 (L) 6.5 - 8.1 g/dL   Albumin 3.0 (L) 3.5 - 5.0 g/dL   AST 18 15 - 41 U/L   ALT 15 0 - 44 U/L   Alkaline Phosphatase 78 38 - 126 U/L   Total Bilirubin 0.5 0.3 - 1.2 mg/dL   Bilirubin, Direct <4.4 0.0 - 0.2 mg/dL   Indirect Bilirubin NOT CALCULATED 0.3 - 0.9 mg/dL    Comment: Performed at [facility], [address], [place], [place] [address]  CBC     Status: None   Collection Time: 08/13/20  5:00 AM  Result Value Ref Range   WBC 5.3 4.0 - 10.5 K/uL   RBC 5.05 4.22 - 5.81 MIL/uL   Hemoglobin 14.6 13.0 - 17.0 g/dL   HCT 25.9 39 - 52 %  MCV 86.7 80.0 - 100.0 fL   MCH 28.9 26.0 - 34.0 pg   MCHC 33.3 30.0 - 36.0 g/dL   RDW 16.112.3 09.611.5 - 04.515.5 %   Platelets 203 150 - 400 K/uL   nRBC 0.0 0.0 - 0.2 %    Comment: Performed at [facility], [address] [place], [place] [address]  Basic metabolic panel     Status: Abnormal   Collection Time: 08/13/20  5:00 AM  Result Value Ref Range   Sodium 135 135 - 145 mmol/L   Potassium 3.6 3.5 - 5.1 mmol/L   Chloride 99 98 - 111 mmol/L   CO2 28 22 - 32 mmol/L   Glucose, Bld 116 (H) 70 - 99 mg/dL    Comment: Glucose reference range applies only to samples taken after fasting for at least 8 hours.   BUN 5 (L) 6 - 20 mg/dL   Creatinine, Ser 1.910.87 0.61 - 1.24 mg/dL   Calcium 8.6 (L) 8.9 - 10.3 mg/dL   GFR calc non Af Amer >60 >60 mL/min   GFR calc Af Amer >60 >60 mL/min   Anion gap 8 5 - 15    Comment: Performed at [facility], [address] [place], [place] [address]      Component Value Date/Time   SDES ABSCESS 08/11/2020 1934   SPECREQUEST BONE SAMPLE B 08/11/2020 1934   CULT RARE STAPHYLOCOCCUS AUREUS 08/11/2020 1934   REPTSTATUS PENDING 08/11/2020 1934   DG Chest Port 1V  same Day  Result Date: 08/12/2020 CLINICAL DATA:  Sob according to order, pt also confirms sob today. EXAM: PORTABLE CHEST 1 VIEW COMPARISON:  None. FINDINGS: Normal mediastinum and cardiac silhouette. Normal pulmonary vasculature. No evidence of effusion, infiltrate, or pneumothorax. No acute bony abnormality. IMPRESSION: No acute cardiopulmonary process. Electronically Signed   By: Genevive BiStewart  Edmunds M.D.   On: 08/12/2020 10:14   Recent Results (from the past 240 hour(s))  SARS Coronavirus 2 by RT PCR (hospital order, performed in [facility] hospital lab) Nasopharyngeal Nasopharyngeal Swab     Status: Abnormal   Collection Time: 08/11/20  7:48 AM   Specimen: Nasopharyngeal Swab  Result Value Ref Range Status   SARS Coronavirus 2 POSITIVE (A) NEGATIVE Final    Comment: emailed L. Berdik RN 12:10 08/11/20 ([email or web address]) (NOTE) SARS-CoV-2 target nucleic acids are DETECTED  SARS-CoV-2 RNA is generally detectable in upper respiratory specimens  during the acute phase of infection.  Positive results are indicative  of the presence of the identified virus, but do not rule out bacterial infection or co-infection with other pathogens not detected by the test.  Clinical correlation with patient history and  other diagnostic information is necessary to determine patient infection status.  The expected result is negative.  Fact Sheet for Patients:   BoilerBrush.com.cyhttps://www.fda.gov/media/136312/download   Fact Sheet for Healthcare Providers:   https://pope.com/https://www.fda.gov/media/136313/download    This test is not yet approved or cleared by the [place] FDA and  has been authorized for detection and/or diagnosis of SARS-CoV-2 by FDA under an Emergency Use Authorization (EUA).  This EUA will remain in effect (meaning this test can be used) for the duration of  the  COVID-19 declaration under Section 564(b)(1) of the Act, 21 U.S.C. section 360-bbb-3(b)(1), unless the authorization is terminated or revoked  sooner.  Performed at [facility], [address] [place], [place] [address]   Aerobic/Anaerobic Culture (surgical/deep wound)     Status: None (Preliminary result)  Collection Time: 08/11/20  7:31 PM   Specimen: PATH Soft tissue  Result Value Ref Range Status   Specimen Description ABSCESS  Final   Special Requests SOFT TISSUE SAMPLE A  Final   Gram Stain   Final    ABUNDANT WBC PRESENT, PREDOMINANTLY PMN MODERATE GRAM POSITIVE COCCI Performed at [facility], [address] [place], [place] [address]    Culture FEW STAPHYLOCOCCUS AUREUS  Final   Report Status PENDING  Incomplete  Aerobic/Anaerobic Culture (surgical/deep wound)     Status: None (Preliminary result)   Collection Time: 08/11/20  7:34 PM   Specimen: Bone; Tissue  Result Value Ref Range Status   Specimen Description ABSCESS  Final   Special Requests BONE SAMPLE B  Final   Gram Stain   Final    RARE WBC PRESENT, PREDOMINANTLY PMN NO ORGANISMS SEEN Performed at [facility], [address], [place], [place] [address]    Culture RARE STAPHYLOCOCCUS AUREUS  Final   Report Status PENDING  Incomplete    Microbiology: Recent Results (from the past 240 hour(s))  SARS Coronavirus 2 by RT PCR (hospital order, performed in [facility] hospital lab) Nasopharyngeal Nasopharyngeal Swab     Status: Abnormal   Collection Time: 08/11/20  7:48 AM   Specimen: Nasopharyngeal Swab  Result Value Ref Range Status   SARS Coronavirus 2 POSITIVE (A) NEGATIVE Final    Comment: emailed L. Berdik RN 12:10 08/11/20 (wilsonm) (NOTE) SARS-CoV-2 target nucleic acids are DETECTED  SARS-CoV-2 RNA is generally detectable in upper respiratory specimens  during the acute phase of infection.  Positive results are indicative  of the presence of the identified virus, but do not rule out bacterial infection or co-infection with other pathogens not detected by the test.  Clinical correlation with patient history and  other  diagnostic information is necessary to determine patient infection status.  The expected result is negative.  Fact Sheet for Patients:   BoilerBrush.com.cy   Fact Sheet for Healthcare Providers:   https://pope.com/    This test is not yet approved or cleared by the [place] FDA and  has been authorized for detection and/or diagnosis of SARS-CoV-2 by FDA under an Emergency Use Authorization (EUA).  This EUA will remain in effect (meaning this test can be used) for the duration of  the  COVID-19 declaration under Section 564(b)(1) of the Act, 21 U.S.C. section 360-bbb-3(b)(1), unless the authorization is terminated or revoked sooner.  Performed at [facility], [address], [place], [place] [address]   Aerobic/Anaerobic Culture (surgical/deep wound)     Status: None (Preliminary result)   Collection Time: 08/11/20  7:31 PM   Specimen: PATH Soft tissue  Result Value Ref Range Status   Specimen Description ABSCESS  Final   Special Requests SOFT TISSUE SAMPLE A  Final   Gram Stain   Final    ABUNDANT WBC PRESENT, PREDOMINANTLY PMN MODERATE GRAM POSITIVE COCCI Performed at [facility], [address] [place], [place] [address]    Culture FEW STAPHYLOCOCCUS AUREUS  Final   Report Status PENDING  Incomplete  Aerobic/Anaerobic Culture (surgical/deep wound)     Status: None (Preliminary result)   Collection Time: 08/11/20  7:34 PM   Specimen: Bone; Tissue  Result Value Ref Range Status   Specimen Description ABSCESS  Final   Special Requests BONE SAMPLE B  Final   Gram Stain   Final    RARE WBC PRESENT, PREDOMINANTLY PMN  NO ORGANISMS SEEN Performed at [facility], [address] [place], [place] [address]    Culture RARE STAPHYLOCOCCUS AUREUS  Final   Report Status PENDING  Incomplete    Radiographs and labs were personally reviewed by me.   Johny Sax, MD [facility] for Infectious  Disease [facility] [phone] 08/13/2020, 1:30 PM

## 2020-08-13 NOTE — Op Note (Signed)
Operative note August 13, 2020  Dominica Severin MD  Preop diagnosis osteomyelitis with deep abscess and flexor as well as extensor tendon infectious tenosynovitis left thumb  Postop diagnosis the same  Operative procedure #1 irrigation debridement and bony curettage left thumb distal phalanx and proximal phalanx at the IP joint which has been rendered incompetent due to osteomyelitis.  Patient has complete joint destruction.  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  Description of procedure patient was taken to the operative theater and underwent smooth induction of general anesthesia.  He has been counseled in regards to the risk and benefits of surgery and other issues.  My notes in his chart highlight these conversations.  It is our attempts to try and eradicate the infection in hopes to give him a thumb which is more usable and workable.  Unfortunately he does not have any interphalangeal joint at the thumb due to osteomyelitis and complete joint destruction.  My hope is we can eradicate the infection and perhaps he will autofused or go back in 6 months or so for a primary fusion of the IP joint to give him a better semblance of thumb and hand function.  Once in the operative theater he was prepped with Hibiclens followed by Betadine scrub performed by myself following this we then called timeout and performed I&D of skin subtenons tissue bone and bony curettage of the distal end of the proximal phalanx and the proximal end of the distal phalanx.  The joint of course was devitalized and gone as noted in the first operation.  Following this I performed a extensor tenolysis tenosynovectomy partial tenotomy of the extensor  apparatus radially was accomplished.  This was performed in an aggressive fashion about the thumb into the area of the MCP joint and distally.  The conditions were improved.  Following this I then performed flexor tenolysis tenosynovectomy of the flexor pollicis longus tendon and performed debridement.  This was due to infectious flexor tenosynovitis.  The volar plate has previously been injured from the infectious sequelae a but small amounts were competent and these were debrided as well.  Following this I placed 3 to 4 L of fluid through and through wounds with cystoscopy tubing.  Following this a bottle of Aricept antibiotic solution was placed.  Following this I performed a loose closure.  Overall wound conditions were much better.  I will await cultures.  Infectious disease consult pending.  Do not plan further irrigation and debridements unless his conditions worsen given the improvement today.  Hopefully will be able to eradicate the infection.  Jeimy Bickert MD

## 2020-08-13 NOTE — Progress Notes (Addendum)
At bedside for PICC placement.  Pt pleasantly refusing PICC placement today.  "I am just too irritable and jittery, can it be done tomorrow?"  Currently has 2 PIv's working well for current meds ordered.  RN aware and states no rush to place today, okay to place PICC tomorrow.  No d/c orders at this time.  Secure chat sent to Dr Randol Kern, Dr Ninetta Lights and Dr Amanda Pea to notify.

## 2020-08-13 NOTE — Anesthesia Procedure Notes (Signed)
Procedure Name: Intubation Date/Time: 08/13/2020 11:25 AM Performed by: Verdie Drown, CRNA Pre-anesthesia Checklist: Patient identified, Emergency Drugs available, Suction available and Patient being monitored Patient Re-evaluated:Patient Re-evaluated prior to induction Oxygen Delivery Method: Circle System Utilized Preoxygenation: Pre-oxygenation with 100% oxygen Induction Type: IV induction and Rapid sequence Laryngoscope Size: Mac and 4 Grade View: Grade I Tube type: Oral Tube size: 7.5 mm Number of attempts: 1 Airway Equipment and Method: Stylet and Oral airway Placement Confirmation: ETT inserted through vocal cords under direct vision,  positive ETCO2 and breath sounds checked- equal and bilateral Secured at: 21 cm Tube secured with: Tape Dental Injury: Teeth and Oropharynx as per pre-operative assessment  Comments: Elective Glidescope d/t Covid +

## 2020-08-13 NOTE — Progress Notes (Signed)
PHARMACY CONSULT NOTE FOR:  OUTPATIENT  PARENTERAL ANTIBIOTIC THERAPY (OPAT)  Indication: Osteo Regimen: Cefazolin 2 g q8h End date: 09/22/2020  IV antibiotic discharge orders are pended. To discharging provider:  please sign these orders via discharge navigator,  Select New Orders & click on the button choice - Manage This Unsigned Work.    Elmer Sow, PharmD, BCPS, BCCCP Clinical Pharmacist [phone]  Please check AMION for all [facility] Pharmacy numbers  08/13/2020 1:57 PM

## 2020-08-14 ENCOUNTER — Encounter (HOSPITAL_COMMUNITY): Payer: Self-pay | Admitting: Orthopedic Surgery

## 2020-08-14 MED ORDER — SODIUM CHLORIDE 0.9% FLUSH: 10.0000 mL | INTRAVENOUS | Status: DC | PRN

## 2020-08-14 MED ORDER — VANCOMYCIN HCL IN DEXTROSE 1-5 GM/200ML-% IV SOLN
1000.0000 mg | Freq: Three times a day (TID) | INTRAVENOUS | Status: DC
  Administered 2020-08-14 – 2020-08-15 (×2): 1000 mg via INTRAVENOUS
  Filled 2020-08-14 (×3): qty 200

## 2020-08-14 MED ORDER — CHLORHEXIDINE GLUCONATE CLOTH 2 % EX PADS
6.0000 | MEDICATED_PAD | Freq: Every day | CUTANEOUS | Status: DC
  Administered 2020-08-14 – 2020-08-15 (×2): 6 via TOPICAL

## 2020-08-14 MED ORDER — VANCOMYCIN HCL 1500 MG/300ML IV SOLN
1500.0000 mg | Freq: Once | INTRAVENOUS | Status: AC
  Administered 2020-08-14: 1500 mg via INTRAVENOUS
  Filled 2020-08-14: qty 300

## 2020-08-14 NOTE — Progress Notes (Signed)
PHARMACY CONSULT NOTE FOR:  OUTPATIENT  PARENTERAL ANTIBIOTIC THERAPY (OPAT)  Indication: MRSA Osteo Regimen: Vancomycin 1g q8h End date: 09/22/2020  IV antibiotic discharge orders are pended. To discharging provider:  please sign these orders via discharge navigator,  Select New Orders & click on the button choice - Manage This Unsigned Work.    Margarite Gouge, PharmD PGY2 ID Pharmacy Resident [phone]  Please check AMION for all [facility] Pharmacy numbers  08/14/2020 3:55 PM

## 2020-08-14 NOTE — Anesthesia Postprocedure Evaluation (Signed)
Anesthesia Post Note  Patient: Kenneth Faulkner  Procedure(s) Performed: IRRIGATION AND DEBRIDEMENT LEFT THUMB (Left )     Patient location during evaluation: PACU Anesthesia Type: General Level of consciousness: awake and alert Pain management: pain level controlled Vital Signs Assessment: post-procedure vital signs reviewed and stable Respiratory status: spontaneous breathing, nonlabored ventilation, respiratory function stable and patient connected to nasal cannula oxygen Cardiovascular status: blood pressure returned to baseline and stable Postop Assessment: no apparent nausea or vomiting Anesthetic complications: no   No complications documented.  Last Vitals:  Vitals:   08/13/20 1430 08/13/20 2142  BP: (!) 158/82 (!) 107/57  Pulse: 71 60  Resp: 17 19  Temp: 36.4 C 36.6 C  SpO2: 100% 99%    Last Pain:  Vitals:   08/13/20 2142  TempSrc: Oral  PainSc:                  [name]

## 2020-08-14 NOTE — Progress Notes (Signed)
Patient ID: Kenneth Faulkner, male   DOB: 1976-11-18, 44 y.o.   MRN: [id number] Chart is been reviewed and I discussed all issues with the patient today.  We will plan for continued aggressive care of his infection.  He understands the plan of care and will plan for dressing change tomorrow.  Notes have been reviewed and plans discussed.  I explained him the culture results in the early bacterial growth that we see.  Omaya Nieland MD

## 2020-08-14 NOTE — Progress Notes (Signed)
[place] for Infectious Disease  Date of Admission:  08/11/2020      Total days of antibiotics 4             ASSESSMENT: Kenneth Faulkner is a 44 y.o. male with osteomyelitis with dep abscess and flexor/extensor tendon tenosynovitis due to methicillin resistant staph aureus and group a streptococcus. He has been to the OR with Dr. Amedeo Plenty twice now for debridement and hopes to salvage the digit. I don't see where he will be planned to return and seems well cleaned out.  Of note he has no evidence of remaining interphalangeal joint d/t infection - hopeful for auto-fusion vs primary surgical fusion in 6 months for improved function later. CRP baseline ~3.0   Will change the antibiotics to Vancomycin to cover both pathogens. He will need follow up by phone in about 3 weeks to check in on higher risk antibiotic.     PLAN: 1. D/C ancef 2. Start vancomycin (unable to do Dapto d/t self pay status)  3. For COVID he only needs 10 days of isolation from positive test. Has been treated with Regen-Cov.  4. OPAT as detailed below    OPAT ORDERS:  Diagnosis: osteomyelitis of finger   Culture Result: MRSA + GAS  No Known Allergies   Discharge antibiotics to be given via PICC line:  Per pharmacy protocol VANCOMYCIN  Aim for Vancomycin trough 15-20 or AUC 400-550 (unless otherwise indicated)   Duration: 6 weeks   End Date: 09/24/2020   [facility] Care Per Protocol with Bi[employer] Use: Home health RN for IV administration and teaching, line care and labs.    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 MRSA now growing on intraop specimens along with group a streptococcus.   Did well with monoclonal antibody treatment for COVID. No symptoms presently --> vaccinated a few months ago with [employer].    Review of Systems: Review of Systems  Constitutional: Negative for chills, fever, malaise/fatigue and weight loss.  HENT: Negative for sore throat.   Respiratory: Negative for cough and sputum production.   Cardiovascular: Negative for chest pain and leg swelling.  Gastrointestinal: Negative for abdominal pain, diarrhea and vomiting.  Genitourinary: Negative for dysuria and flank pain.  Musculoskeletal: Positive for joint pain. Negative for myalgias and neck pain.  Skin: Negative for rash.  Neurological: Negative for dizziness, tingling and headaches.  Psychiatric/Behavioral: The patient is nervous/anxious.     No Known Allergies  OBJECTIVE: Vitals:   08/13/20 2142 08/13/20 2200 08/14/20 0508 08/14/20 0744  BP: (!) 107/57  136/73 135/72  Pulse: 60 61 67 65  Resp: 19  17 17   Temp: 97.9 F (36.6 C)  32 F (36.7 C) 98.2 F (36.8 C)  TempSrc: Oral  Oral Oral  SpO2: 99% 96% 100% 100%  Weight:      Height:       Body mass index is 26.73 kg/m.  Physical Exam Constitutional:      Comments: Resting quietly in bed.   Cardiovascular:     Rate and Rhythm: Normal rate and regular rhythm.  Abdominal:     Palpations: Abdomen is soft.     Tenderness: There is no abdominal tenderness.  Skin:    General: Skin is warm and dry.  Neurological:     Mental Status: He is alert and oriented to person, place, and time.     Lab Results Lab Results  Component  Value Date   WBC 5.3 08/13/2020   HGB 14.6 08/13/2020   HCT 43.8 08/13/2020   MCV 86.7 08/13/2020   PLT 203 08/13/2020    Lab Results  Component Value Date   CREATININE 0.87 08/13/2020   BUN 5 (L) 08/13/2020   NA 135 08/13/2020   K 3.6 08/13/2020   CL 99 08/13/2020   CO2 28 08/13/2020    Lab Results  Component Value Date   ALT 15 08/12/2020   AST 18 08/12/2020   ALKPHOS 78 08/12/2020   BILITOT 0.5 08/12/2020     Microbiology: Recent Results (from the past 240 hour(s))  SARS Coronavirus 2 by RT PCR (hospital order, performed in [facility] lab) Nasopharyngeal Nasopharyngeal Swab     Status: Abnormal   Collection Time: 08/11/20  7:48 AM   Specimen: Nasopharyngeal Swab  Result Value Ref Range Status   SARS Coronavirus 2 POSITIVE (A) NEGATIVE Final    Comment: emailed L. Berdik RN 12:10 08/11/20 ([email or web address]) (NOTE) SARS-CoV-2 target nucleic acids are DETECTED  SARS-CoV-2 RNA is generally detectable in upper respiratory specimens  during the acute phase of infection.  Positive results are indicative  of the presence of the identified virus, but do not rule out bacterial infection or co-infection with other pathogens not detected by the test.  Clinical correlation with patient history and  other diagnostic information is necessary to determine patient infection status.  The expected result is negative.  Fact Sheet for Patients:   StrictlyIdeas.no   Fact Sheet for Healthcare Providers:   BankingDealers.co.za    This test is not yet approved or cleared by the [place] FDA and  has been authorized for detection and/or diagnosis of SARS-CoV-2 by FDA under an Emergency Use Authorization (EUA).  This EUA will remain in effect (meaning this test can be used) for the duration of  the  COVID-19 declaration under Section 564(b)(1) of the Act, 21 U.S.C. section 360-bbb-3(b)(1), unless the authorization is terminated or  revoked sooner.  Performed at [facility], [place] [address], [place], [place] [address]   Aerobic/Anaerobic Culture (surgical/deep wound)     Status: None (Preliminary result)   Collection Time: 08/11/20  7:31 PM   Specimen: PATH Soft tissue  Result Value Ref Range Status   Specimen Description ABSCESS  Final   Special Requests SOFT TISSUE SAMPLE A  Final   Gram Stain   Final    ABUNDANT WBC PRESENT, PREDOMINANTLY PMN MODERATE GRAM POSITIVE COCCI Performed at [facility], [address] [place], [place] [address]    Culture   Final    FEW METHICILLIN RESISTANT STAPHYLOCOCCUS AUREUS MODERATE GROUP A STREP (S.PYOGENES) ISOLATED    Report Status PENDING  Incomplete  Organism ID, Bacteria METHICILLIN RESISTANT STAPHYLOCOCCUS AUREUS  Final      Susceptibility   Methicillin resistant staphylococcus aureus - MIC*    CIPROFLOXACIN >=8 RESISTANT Resistant     ERYTHROMYCIN >=8 RESISTANT Resistant     GENTAMICIN <=0.5 SENSITIVE Sensitive     OXACILLIN >=4 RESISTANT Resistant     TETRACYCLINE <=1 SENSITIVE Sensitive     VANCOMYCIN <=0.5 SENSITIVE Sensitive     TRIMETH/SULFA >=320 RESISTANT Resistant     CLINDAMYCIN >=8 RESISTANT Resistant     RIFAMPIN <=0.5 SENSITIVE Sensitive     Inducible Clindamycin NEGATIVE Sensitive     * FEW METHICILLIN RESISTANT STAPHYLOCOCCUS AUREUS  Aerobic/Anaerobic Culture (surgical/deep wound)     Status: None (Preliminary result)   Collection Time: 08/11/20  7:34 PM   Specimen: Bone; Tissue  Result Value Ref Range Status   Specimen Description ABSCESS  Final   Special Requests BONE SAMPLE B  Final   Gram Stain   Final    RARE WBC PRESENT, PREDOMINANTLY PMN NO ORGANISMS SEEN Performed at [facility], [address] [place], [place] [address]    Culture RARE METHICILLIN RESISTANT STAPHYLOCOCCUS AUREUS  Final   Report Status PENDING  Incomplete   Organism ID, Bacteria METHICILLIN RESISTANT STAPHYLOCOCCUS AUREUS  Final       Susceptibility   Methicillin resistant staphylococcus aureus - MIC*    CIPROFLOXACIN >=8 RESISTANT Resistant     ERYTHROMYCIN >=8 RESISTANT Resistant     GENTAMICIN <=0.5 SENSITIVE Sensitive     OXACILLIN >=4 RESISTANT Resistant     TETRACYCLINE <=1 SENSITIVE Sensitive     VANCOMYCIN <=0.5 SENSITIVE Sensitive     TRIMETH/SULFA >=320 RESISTANT Resistant     CLINDAMYCIN >=8 RESISTANT Resistant     RIFAMPIN <=0.5 SENSITIVE Sensitive     Inducible Clindamycin NEGATIVE Sensitive     * RARE METHICILLIN RESISTANT STAPHYLOCOCCUS AUREUS     Janene Madeira, MSN, NP-C [place] for Infectious Disease [email or web address] Pager: [phone] Office: [phone] [place]: [phone]

## 2020-08-14 NOTE — Progress Notes (Signed)
Peripherally Inserted Central Catheter Placement  The IV Nurse has discussed with the patient and/or persons authorized to consent for the patient, the purpose of this procedure and the potential benefits and risks involved with this procedure.  The benefits include less needle sticks, lab draws from the catheter, and the patient may be discharged home with the catheter. Risks include, but not limited to, infection, bleeding, blood clot (thrombus formation), and puncture of an artery; nerve damage and irregular heartbeat and possibility to perform a PICC exchange if needed/ordered by physician.  Alternatives to this procedure were also discussed.  Bard Power PICC patient education guide, fact sheet on infection prevention and patient information card has been provided to patient /or left at bedside.    PICC Placement Documentation  PICC Single Lumen 08/14/20 PICC Right Basilic 39 cm 0 cm (Active)  Indication for Insertion or Continuance of Line Home intravenous therapies (PICC only) 08/14/20 1505  Exposed Catheter (cm) 0 cm 08/14/20 1505  Site Assessment Clean;Dry;Intact 08/14/20 1505  Line Status Flushed;Saline locked;Blood return noted 08/14/20 1505  Dressing Type Transparent 08/14/20 1505  Dressing Status Clean;Dry;Intact;Antimicrobial disc in place 08/14/20 1505  Safety Lock Not Applicable 08/14/20 1505  Line Care Connections checked and tightened 08/14/20 1505  Line Adjustment (NICU/IV Team Only) No 08/14/20 1505  Dressing Intervention New dressing 08/14/20 1505  Dressing Change Due 08/21/20 08/14/20 1505       Gavyn Zoss, Lajean Manes 08/14/2020, 3:07 PM

## 2020-08-14 NOTE — Progress Notes (Signed)
Pharmacy Antibiotic Note  Kenneth Faulkner is a 44 y.o. male admitted on 08/11/2020 with imaging showing L-thumb osteo. Patient is POD1 s/p I&D with operative cultures now growing MRSA. Pharmacy has been consulted for Vancomycin dosing. Patient's CrCl ~107 ml/min with IBW. Will reload the patient given 24 hours off of vancomycin and plan to continue original plan of 1g q8h for goal trough 15-20 mg/mL.   Plan: - Vancomycin 1500 mg IV x 1 followed by 1g q8h - Will continue to follow renal function, culture results, and LOT   Height: 5\' 7"  (170.2 cm) Weight: 77.4 kg (170 lb 10.2 oz) IBW/kg (Calculated) : 66.1  Temp (24hrs), Avg:97.9 F (36.6 C), Min:97.6 F (36.4 C), Max:98.2 F (36.8 C)  Recent Labs  Lab 08/11/20 0203 08/12/20 0250 08/13/20 0500  WBC 7.8 4.3 5.3  CREATININE 0.90 0.83 0.87  LATICACIDVEN 1.8  --   --     Estimated Creatinine Clearance: 99.2 mL/min (by C-G formula based on SCr of 0.87 mg/dL).    No Known Allergies  Antimicrobials this admission: Cefepime 9/10>>9/12 Vanc 9/10>>9/12, 9/13>> Regen COV 9/11 x 1 Cefazolin 9/12>>9/13  Microbiology results: 9/10 COVID >> 9/10 abscess >MRSA 9/10 bone > MRSA  Thank you for allowing pharmacy to be a part of this patient's care.  11/10, PharmD PGY2 ID Pharmacy Resident [phone]  **Pharmacist phone directory can now be found on [email or web address] ([email or web address]).  Listed under [facility].

## 2020-08-14 NOTE — TOC Initial Note (Addendum)
Transition of Care [facility]) - Initial/Assessment Note    Patient Details  Name: Kenneth Faulkner MRN: [id number] Date of Birth: 12/24/1975  Transition of Care [facility]) CM/SW Contact:    Janae Bridgeman, RN Phone Number: 08/14/2020, 1:21 PM  Clinical Narrative:                 Case management called and spoke with the patient under COVID isolation regarding coordination of home antibiotic infusions.  The patient lives at home with his fiance, Herbert Seta, who is also [facility] [place] [facility] for COVID infection.  The patient developed a thumb infection and I&D done on 08/13/2020.  A PICC line will be placed today for IV antibiotic treatments for home.  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  I called and spoke with Jeri Modena, RN with [facility] to consult for IV antibiotic coordination.  IV antibiotic teaching, labwork and dressing changes will be coordinated through [facility].  Both home health agencies are noted in the discharge instructions.  The patient was vaccinated through [place] [facility] [place] a few months ago and is positive for COVID but is without symptoms.  Will continue to follow for discharge plans.  05/14/2020 1500- [facility] and [facility] declined care for patient due to staffing issues.  The patient will be getting Van 1000 mg IV every 8 hours with needed Vancomycin labs and PICC line dressing changes routinely.  I called [facility] and offer LOG for payment - waiting on return call from [facility] with [facility].  Expected Discharge Plan: Home w Home Health Services Barriers to Discharge: Continued Medical Work up   Patient Goals and CMS Choice Patient states their goals for this hospitalization and ongoing recovery are:: Patient plans to go home with home health for IV antibiotic infusions. CMS Medicare.gov Compare Post Acute Care list provided to:: Patient Choice  offered to / list presented to : Patient  Expected Discharge Plan and Services Expected Discharge Plan: Home w Home Health Services   Discharge Planning Services: CM Consult Post Acute Care Choice: Home Health Living arrangements for the past 2 months: Single Family Home                           HH Arranged: RN, IV Antibiotics HH Agency: Surveyor, mining ([facility] for RN - PICC lines dressing changes and labs) Date [facility] Agency Contacted: 08/14/20 Time HH Agency Contacted: [phone] Representative spoke with at [facility] Agency: Jeri Modena, RN - [facility]  Prior Living Arrangements/Services Living arrangements for the past 2 months: Single Family Home Lives with:: Significant Other Eugene Gavia - [phone]) Patient language and need for interpreter reviewed:: Yes Do you feel safe going back to the place where you live?: Yes      Need for Family Participation in Patient Care: Yes (Comment) Care giver support system in place?: Yes (comment)   Criminal Activity/Legal Involvement Pertinent to Current Situation/Hospitalization: No - Comment as needed  Activities of Daily Living Home Assistive Devices/Equipment: None ADL Screening (condition at time of admission) Patient's cognitive ability adequate to safely complete daily activities?: Yes Is the patient deaf or have difficulty hearing?: No Does the patient have difficulty seeing, even when wearing glasses/contacts?: No Does the patient have difficulty concentrating, remembering, or making decisions?: No Patient able to express need for assistance with ADLs?:  Yes Does the patient have difficulty dressing or bathing?: No Independently performs ADLs?: Yes (appropriate for developmental age) Does the patient have difficulty walking or climbing stairs?: No Weakness of Legs: None Weakness of Arms/Hands: None  Permission Sought/Granted Permission sought to share information with : Case Manager Permission  granted to share information with : Yes, Verbal Permission Granted     Permission granted to share info w AGENCY: [facility] - Iv antibiotic coordination, [facility] - PICC line dressing changes and labwork  Permission granted to share info w Relationship: Lars Masson, fiance [phone]     Emotional Assessment       Orientation: : Oriented to Self, Oriented to Place, Oriented to  Time, Oriented to Situation Alcohol / Substance Use: Alcohol Use, Illicit Drugs, Tobacco Use Psych Involvement: No (comment)  Admission diagnosis:  Wound cellulitis [L03.90] Osteomyelitis, unspecified site, unspecified type [facility]) [M86.9] Patient Active Problem List   Diagnosis Date Noted  . Wound cellulitis 08/11/2020  . Nicotine use disorder 08/11/2020  . Osteomyelitis (HCC), left thumb 08/11/2020   PCP:  Patient, [name] Pharmacy:   [id number] - [place], [place] - [address] AT [facility] [address] [place] [address] Phone: [phone] Fax: [phone]  [id number] - [address] ([place]), [place] - [address] [place] [address] Phone: [phone] Fax: [phone]     Social Determinants of Health (SDOH) Interventions    Readmission Risk Interventions Readmission Risk Prevention Plan 08/14/2020  Post Dischage Appt Complete  Medication Screening Complete  Transportation Screening Complete  Some recent data might be hidden

## 2020-08-14 NOTE — Progress Notes (Signed)
PROGRESS NOTE                                                                                                                                                                                                             Patient Demographics:    Kenneth Faulkner, is a 44 y.o. male, DOB - 04/28/1976, GNF:[id number]  Outpatient Primary MD for the patient is Patient, [name]   Admit date - 08/11/2020   LOS - 3  Chief Complaint  Patient presents with  . Hand Pain       Brief Narrative: Patient is a 44 y.o. male with no past medical history-who had a wood splinter puncture to his left thumb-subsequently had pain and swelling for approximately 10-12 days before seeking medical attention in the emergency room on 9/10-found to have soft tissue infection with underlying osteomyelitis and incidental COVID-19 infection.  Admitted to the hospitalist service for further evaluation and treatment.  COVID-19 vaccinated status: Vaccinated  Significant Events: 9/10>> [facility] thumb cellulitis with soft tissue infection-incidental COVID-19 infection  Significant studies: 9/11>>Chest x-ray: No acute cardiopulmonary process 9/12>>Operative procedure #1 irrigation debridement and bony curettage left thumb distal phalanx and proximal phalanx at the IP joint which has been rendered incompetent due to osteomyelitis.  Patient has complete joint destruction.  #2 flexor tenolysis tenosynovectomy secondary to infectious flexor tenosynovitis #3 extensor tenolysis tendon synovectomy and partial tenotomy secondary to chronic extensor infectious tenosynovitis   COVID-19 medications: None  Antibiotics: Vancomycin: 9/10>>9/12 Cefepime: 9/10>>9/12 IV ancef: 9/12  Microbiology data: 9/19>> wound culture/intraoperative culture: MRSA  Procedures: 9/10>> irrigation and debridement abscess left thumb, bony debridement and resection of  destroyed DIP  Consults: None  DVT prophylaxis: enoxaparin (LOVENOX) injection 40 mg Start: 08/12/20 1100 SCDs Start: 08/11/20 1756  Prophylactic Lovenox    Subjective:    Kenneth Faulkner today denies any complaints, no shortness of breath, cough or fever .    Assessment  & Plan :   Acute Left thumb osteomyelitis:  -Postop care deferred to hand surgery -Status post incision and drainage and washout on 9/10 and 9/12, no further washout and debridement currently required per hand surgery -ID has been consulted by hand surgery for further antibiotic recommendations, initially on vancomycin and cefepime, transitioned to IV Ancef 9/12, intraoperative culture showing MRSA, will await ID input regarding  antibiotic recommendations . -PICC line requested regarding anticipation of prolonged antibiotic therapy.  COVID-19 infection: Asymptomatic-fully vaccinated with J&J vaccine couple months ago.  CRP/D-dimer minimally elevated but suspect this is from surgical issues rather than COVID-19 issues.  -Received monoclonal antibody 9/12.   COVID-19 Labs: Recent Labs    08/12/20 0250  DDIMER 0.69*  FERRITIN 111  CRP 2.4*    No results found for: BNP  No results for input(s): PROCALCITON in the last 168 hours.  Lab Results  Component Value Date   SARSCOV2NAA POSITIVE (A) 08/11/2020    Tobacco abuse: Continue transdermal nicotine   ABG:    Component Value Date/Time   TCO2 24 03/26/2020 1619    Vent Settings: N/A  Condition - Stable  Family Communication  : Patient update family himself.  Code Status :  Full Code  Diet :  Diet Order            Diet regular Room service appropriate? Yes; Fluid consistency: Thin  Diet effective now                  Disposition Plan  :   Status is: Inpatient  Remains inpatient appropriate because:Inpatient level of care appropriate due to severity of illness   Dispo: The patient is from: Home              Anticipated d/c is to:  Home              Anticipated d/c date is: 1 day              Patient currently is not medically stable to d/c.   Barriers to discharge: Hypoxia requiring O2 supplementation/complete 5 days of IV Remdesivir  Antimicorbials  :    Anti-infectives (From admission, onward)   Start     Dose/Rate Route Frequency Ordered Stop   08/13/20 1400  ceFAZolin (ANCEF) IVPB 2g/100 mL premix        2 g 200 mL/hr over 30 Minutes Intravenous Every 8 hours 08/13/20 1355     08/11/20 1800  vancomycin (VANCOCIN) IVPB 1000 mg/200 mL premix  Status:  Discontinued        1,000 mg 200 mL/hr over 60 Minutes Intravenous Every 8 hours 08/11/20 0849 08/13/20 1343   08/11/20 1800  ceFEPIme (MAXIPIME) 2 g in sodium chloride 0.9 % 100 mL IVPB  Status:  Discontinued        2 g 200 mL/hr over 30 Minutes Intravenous Every 8 hours 08/11/20 0849 08/13/20 1343   08/11/20 0900  vancomycin (VANCOREADY) IVPB 1500 mg/300 mL        1,500 mg 150 mL/hr over 120 Minutes Intravenous  Once 08/11/20 0849 08/11/20 1504   08/11/20 0900  ceFEPIme (MAXIPIME) 2 g in sodium chloride 0.9 % 100 mL IVPB        2 g 200 mL/hr over 30 Minutes Intravenous  Once 08/11/20 0849 08/11/20 1030   08/11/20 0745  clindamycin (CLEOCIN) IVPB 600 mg  Status:  Discontinued        600 mg 100 mL/hr over 30 Minutes Intravenous  Once 08/11/20 0744 08/11/20 0850      Inpatient Medications  Scheduled Meds: . vitamin C  1,000 mg Oral Daily  . enoxaparin (LOVENOX) injection  40 mg Subcutaneous Q24H  . nicotine  21 mg Transdermal Daily   Continuous Infusions: . sodium chloride Stopped (08/12/20 1732)  . sodium chloride    .  ceFAZolin (ANCEF) IV 2 g (08/14/20 0554)  .  famotidine (PEPCID) IV     PRN Meds:.sodium chloride, albuterol, diphenhydrAMINE, EPINEPHrine, famotidine (PEPCID) IV, HYDROcodone-acetaminophen, HYDROmorphone (DILAUDID) injection, methocarbamol, methylPREDNISolone (SOLU-MEDROL) injection, ondansetron **OR** ondansetron (ZOFRAN) IV,  oxyCODONE   Time Spent in minutes  25  See all Orders from today for further details   Huey Bienenstock M.D on 08/14/2020 at 12:19 PM  To page go to [email or web address] - use universal password  [facility] -  Office  [phone]    Objective:   Vitals:   08/13/20 2142 08/13/20 2200 08/14/20 0508 08/14/20 0744  BP: (!) 107/57  136/73 135/72  Pulse: 60 61 67 65  Resp: 19  17 17   Temp: 97.9 F (36.6 C)  98 F (36.7 C) 98.2 F (36.8 C)  TempSrc: Oral  Oral Oral  SpO2: 99% 96% 100% 100%  Weight:      Height:        Wt Readings from Last 3 Encounters:  08/12/20 77.4 kg  03/26/20 81.6 kg  02/06/19 79.4 kg     Intake/Output Summary (Last 24 hours) at 08/14/2020 1219 Last data filed at 08/14/2020 0900 Gross per 24 hour  Intake 733.33 ml  Output 0 ml  Net 733.33 ml     Physical Exam  Awake Alert, Oriented X 3, No new F.N deficits, Normal affect Symmetrical Chest wall movement, Good air movement bilaterally, CTAB RRR,No Gallops,Rubs or new Murmurs, No Parasternal Heave +ve B.Sounds, Abd Soft, No tenderness, No rebound - guarding or rigidity. No Cyanosis, Clubbing or edema, left arm and wrist bandaged with Ace wrap.   Data Review:    CBC Recent Labs  Lab 08/11/20 0203 08/12/20 0250 08/13/20 0500  WBC 7.8 4.3 5.3  HGB 13.3 13.3 14.6  HCT 40.2 39.7 43.8  PLT 286 223 203  MCV 88.2 88.2 86.7  MCH 29.2 29.6 28.9  MCHC 33.1 33.5 33.3  RDW 12.4 12.4 12.3  LYMPHSABS 1.1  --   --   MONOABS 0.5  --   --   EOSABS 0.4  --   --   BASOSABS 0.0  --   --     Chemistries  Recent Labs  Lab 08/11/20 0203 08/12/20 0250 08/13/20 0500  NA 138 138 135  K 3.4* 3.5 3.6  CL 103 103 99  CO2 23 27 28   GLUCOSE 127* 123* 116*  BUN 10 5* 5*  CREATININE 0.90 0.83 0.87  CALCIUM 8.8* 8.2* 8.6*  AST 22 18  --   ALT 20 15  --   ALKPHOS 80 78  --   BILITOT 0.3 0.5  --     ------------------------------------------------------------------------------------------------------------------ No results for input(s): CHOL, HDL, LDLCALC, TRIG, CHOLHDL, LDLDIRECT in the last 72 hours.  No results found for: HGBA1C ------------------------------------------------------------------------------------------------------------------ No results for input(s): TSH, T4TOTAL, T3FREE, THYROIDAB in the last 72 hours.  Invalid input(s): FREET3 ------------------------------------------------------------------------------------------------------------------ Recent Labs    08/12/20 0250  FERRITIN 111    Coagulation profile No results for input(s): INR, PROTIME in the last 168 hours.  Recent Labs    08/12/20 0250  DDIMER 0.69*    Cardiac Enzymes No results for input(s): CKMB, TROPONINI, MYOGLOBIN in the last 168 hours.  Invalid input(s): CK ------------------------------------------------------------------------------------------------------------------ No results found for: BNP  Micro Results Recent Results (from the past 240 hour(s))  SARS Coronavirus 2 by RT PCR (hospital order, performed in [facility] hospital lab) Nasopharyngeal Nasopharyngeal Swab     Status: Abnormal   Collection Time: 08/11/20  7:48 AM   Specimen: Nasopharyngeal Swab  Result  Value Ref Range Status   SARS Coronavirus 2 POSITIVE (A) NEGATIVE Final    Comment: emailed L. Berdik RN 12:10 08/11/20 ([email or web address]) (NOTE) SARS-CoV-2 target nucleic acids are DETECTED  SARS-CoV-2 RNA is generally detectable in upper respiratory specimens  during the acute phase of infection.  Positive results are indicative  of the presence of the identified virus, but do not rule out bacterial infection or co-infection with other pathogens not detected by the test.  Clinical correlation with patient history and  other diagnostic information is necessary to determine patient infection status.  The expected result is  negative.  Fact Sheet for Patients:   BoilerBrush.com.cy   Fact Sheet for Healthcare Providers:   https://pope.com/    This test is not yet approved or cleared by the [place] FDA and  has been authorized for detection and/or diagnosis of SARS-CoV-2 by FDA under an Emergency Use Authorization (EUA).  This EUA will remain in effect (meaning this test can be used) for the duration of  the  COVID-19 declaration under Section 564(b)(1) of the Act, 21 U.S.C. section 360-bbb-3(b)(1), unless the authorization is terminated or revoked sooner.  Performed at [facility], [address], [place], [place] [address]   Aerobic/Anaerobic Culture (surgical/deep wound)     Status: None (Preliminary result)   Collection Time: 08/11/20  7:31 PM   Specimen: PATH Soft tissue  Result Value Ref Range Status   Specimen Description ABSCESS  Final   Special Requests SOFT TISSUE SAMPLE A  Final   Gram Stain   Final    ABUNDANT WBC PRESENT, PREDOMINANTLY PMN MODERATE GRAM POSITIVE COCCI Performed at [facility], [address], [place], [place] [address]    Culture   Final    FEW METHICILLIN RESISTANT STAPHYLOCOCCUS AUREUS MODERATE GROUP A STREP (S.PYOGENES) ISOLATED    Report Status PENDING  Incomplete   Organism ID, Bacteria METHICILLIN RESISTANT STAPHYLOCOCCUS AUREUS  Final      Susceptibility   Methicillin resistant staphylococcus aureus - MIC*    CIPROFLOXACIN >=8 RESISTANT Resistant     ERYTHROMYCIN >=8 RESISTANT Resistant     GENTAMICIN <=0.5 SENSITIVE Sensitive     OXACILLIN >=4 RESISTANT Resistant     TETRACYCLINE <=1 SENSITIVE Sensitive     VANCOMYCIN <=0.5 SENSITIVE Sensitive     TRIMETH/SULFA >=320 RESISTANT Resistant     CLINDAMYCIN >=8 RESISTANT Resistant     RIFAMPIN <=0.5 SENSITIVE Sensitive     Inducible Clindamycin NEGATIVE Sensitive     * FEW METHICILLIN RESISTANT STAPHYLOCOCCUS AUREUS  Aerobic/Anaerobic Culture  (surgical/deep wound)     Status: None (Preliminary result)   Collection Time: 08/11/20  7:34 PM   Specimen: Bone; Tissue  Result Value Ref Range Status   Specimen Description ABSCESS  Final   Special Requests BONE SAMPLE B  Final   Gram Stain   Final    RARE WBC PRESENT, PREDOMINANTLY PMN NO ORGANISMS SEEN Performed at [facility], [address] [place], [place] [address]    Culture RARE METHICILLIN RESISTANT STAPHYLOCOCCUS AUREUS  Final   Report Status PENDING  Incomplete   Organism ID, Bacteria METHICILLIN RESISTANT STAPHYLOCOCCUS AUREUS  Final      Susceptibility   Methicillin resistant staphylococcus aureus - MIC*    CIPROFLOXACIN >=8 RESISTANT Resistant     ERYTHROMYCIN >=8 RESISTANT Resistant     GENTAMICIN <=0.5 SENSITIVE Sensitive     OXACILLIN >=4 RESISTANT Resistant     TETRACYCLINE <=1 SENSITIVE Sensitive  VANCOMYCIN <=0.5 SENSITIVE Sensitive     TRIMETH/SULFA >=320 RESISTANT Resistant     CLINDAMYCIN >=8 RESISTANT Resistant     RIFAMPIN <=0.5 SENSITIVE Sensitive     Inducible Clindamycin NEGATIVE Sensitive     * RARE METHICILLIN RESISTANT STAPHYLOCOCCUS AUREUS    Radiology Reports DG Chest Port 1V same Day  Result Date: 08/12/2020 CLINICAL DATA:  Sob according to order, pt also confirms sob today. EXAM: PORTABLE CHEST 1 VIEW COMPARISON:  None. FINDINGS: Normal mediastinum and cardiac silhouette. Normal pulmonary vasculature. No evidence of effusion, infiltrate, or pneumothorax. No acute bony abnormality. IMPRESSION: No acute cardiopulmonary process. Electronically Signed   By: Genevive Bi M.D.   On: 08/12/2020 10:14   DG Hand Complete Left  Result Date: 08/11/2020 CLINICAL DATA:  Finger fractures.  Infected left thumb. EXAM: LEFT HAND - COMPLETE 3+ VIEW COMPARISON:  04/28/2020. FINDINGS: Extensive soft tissue swelling noted about the left thumb. Erosive changes noted about the distal aspect of the proximal phalanx and proximal aspect of distal phalanx  of the thumb. These findings suggest osteomyelitis and possible septic arthritis. Associated fracture of the base of the distal phalanx of the left thumb noted. Nonunited angulated comminuted fracture of the proximal phalanx of the left second digit noted. Tiny radiopaque foreign bodies in the soft tissues adjacent to the left thumb cannot be excluded. IMPRESSION: 1. Severe soft tissue swelling left thumb. Erosive changes noted about the distal aspect of the proximal phalanx and proximal aspect of the distal phalanx of the left thumb. These findings suggest osteomyelitis and possible septic arthritis. Associated fracture of the base of the distal phalanx of the left thumb noted. Tiny radiopaque foreign bodies in the soft tissues adjacent to the left thumb cannot be excluded. 2. Nonunited angulated comminuted fracture of the proximal phalanx of the left second digit noted Electronically Signed   By: Maisie Fus  Register   On: 08/11/2020 09:02   DG Finger Thumb Left  Result Date: 08/11/2020 CLINICAL DATA:  Rule out foreign body. Patient presents with wound to left thumb for 1 week. Thumb is rib swollen, red and warm to touch. Reported splinter 8 days ago. EXAM: LEFT THUMB 2+V COMPARISON:  Hand radiographs 04/28/2020 FINDINGS: Marked soft tissue swelling of the thumb. There is a minimally displaced intra-articular fracture through the lateral base of the distal phalanx. There is surrounding lucency of the distal phalanx and the adjacent head of the proximal phalanx with associated erosive change along the volar aspect, best seen on the lateral. Punctate linear densities in the soft tissues of the volar and dorsal aspect of the thumb. 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 IMPRESSION: 1. Acute mildly displaced intra-articular fracture through the lateral base of the distal phalanx of the thumb. 2. Surrounding osseous lucency of the distal phalanx  of the thumb and erosive change involving the adjacent head of the proximal phalanx, which is compatible with osteomyelitis. 3. Surrounding soft tissue swelling, compatible with cellulitis. Punctate linear densities along the volar and dorsal soft tissues of the thumb, which may represent small soft tissue foreign bodies or areas of mineralization. 4. 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 Dedicated hand radiographs could further evaluate if clinically indicated. Electronically Signed   By: Feliberto Harts MD   On: 08/11/2020 08:28   [place] EKG SITE RITE  Result Date: 08/13/2020 If Site Rite image not attached, placement could not be confirmed due to current cardiac rhythm.

## 2020-08-15 DIAGNOSIS — U071 COVID-19: Secondary | ICD-10-CM | POA: Diagnosis present

## 2020-08-15 MED ORDER — VANCOMYCIN HCL 1250 MG/250ML IV SOLN
1250.0000 mg | Freq: Two times a day (BID) | INTRAVENOUS | Status: DC
  Filled 2020-08-15: qty 250

## 2020-08-15 MED ORDER — VANCOMYCIN HCL IN DEXTROSE 1-5 GM/200ML-% IV SOLN
1000.0000 mg | Freq: Three times a day (TID) | INTRAVENOUS | Status: DC
  Administered 2020-08-15 – 2020-08-16 (×3): 1000 mg via INTRAVENOUS
  Filled 2020-08-15 (×5): qty 200

## 2020-08-15 MED ORDER — HYDROMORPHONE HCL 1 MG/ML IJ SOLN
0.5000 mg | INTRAMUSCULAR | Status: DC | PRN
  Administered 2020-08-15 – 2020-08-16 (×4): 0.5 mg via INTRAVENOUS
  Filled 2020-08-15 (×4): qty 0.5

## 2020-08-15 NOTE — Progress Notes (Signed)
Patient ID: Kenneth Faulkner, male   DOB: February 05, 1976, 44 y.o.   MRN: [id number] Patient seen at bedside.  I very carefully removed his bandage.  Following this we performed dressing change.  The wound looks much better.  Erythema/cellulitis and his edema have markedly improved.  The patient and I discussed all issues.  I will have him return to my office in 8 to 10 days.  I will ask him to keep the bandage that I have replaced today clean and dry until that time.  He will avoid perspiration.  Patient I discussed all issues plans and concerns.  Once again our goal is to eradicate the osteomyelitis with aggressive surgical debridements which are being performed followed by necessary antibiotic coverage.  It is our hope that we can eradicate the infection and give him a usable thumb with possibility of fusion of the IP joint in the future.  I have outlined this to the patient at length and I do feel he has a very good general understanding.  I have asked him to call the office so I can see him in 10 days.  I went through all issues at great length with him.  Pleasure to see him today.  Darrell Leonhardt MD

## 2020-08-15 NOTE — Progress Notes (Addendum)
PHARMACY CONSULT NOTE FOR:  OUTPATIENT  PARENTERAL ANTIBIOTIC THERAPY (OPAT)  Indication: MRSA Osteomyelitis Regimen: Vancomycin 1000g q8h End date: 09/24/2020  IV antibiotic discharge orders are pended. To discharging provider:  please sign these orders via discharge navigator,  Select New Orders & click on the button choice - Manage This Unsigned Work.    Jettie Pagan, PharmD Infectious Disease Pharmacist  Phone: [phone]  Please check AMION for all [facility] Pharmacy numbers  08/15/2020 9:23 AM

## 2020-08-15 NOTE — Progress Notes (Signed)
[facility] Infectious [facility]  Date of Admission:  08/11/2020      Total days of antibiotics 5, Vancomycin              ASSESSMENT: Kenneth Faulkner is a 44 y.o. male with osteomyelitis with dep abscess and flexor/extensor tendon tenosynovitis due to methicillin resistant staph aureus and group a streptococcus. Dr. Amedeo Plenty has seen him today for a wound check and it appears he is ready for outpatient care and follow up.    We are available for questions, otherwise happy to see him back in ID clinic as arranged.    PLAN: 1. Continue vancomycin  2. OPAT as detailed below    OPAT ORDERS:  Diagnosis: osteomyelitis of finger   Culture Result: MRSA + GAS  No Known Allergies   Discharge antibiotics to be given via PICC line:  Per pharmacy protocol VANCOMYCIN  Aim for Vancomycin trough 15-20 or AUC 400-550 (unless otherwise indicated)   Duration: 6 weeks   End Date: 09/24/2020   [facility] Care Per Protocol with Biopatch Use: Home health RN for IV administration and teaching, line care and labs.    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    Review  of Systems: Review of Systems  Constitutional: Negative for chills, fever, malaise/fatigue and weight loss.  HENT: Negative for sore throat.   Respiratory: Negative for cough and sputum production.   Cardiovascular: Negative for chest pain and leg swelling.  Gastrointestinal: Negative for abdominal pain, diarrhea and vomiting.  Genitourinary: Negative for dysuria and flank pain.  Musculoskeletal: Positive for joint pain. Negative for myalgias and neck pain.  Skin: Negative for rash.  Neurological: Negative for dizziness, tingling and headaches.  Psychiatric/Behavioral: The patient is nervous/anxious.     No Known Allergies  OBJECTIVE: Vitals:   08/14/20 0744 08/14/20 1309 08/14/20 1900 08/15/20 0500  BP: 135/72 130/72 138/79 133/76  Pulse: 65 75 (!) 52 60  Resp: _0 Temp: 98.2 F (36.8 C) 98.4 F (36.9 C) 98.5 F (36.9 C) 98.5 F (36.9 C)  TempSrc: Oral Oral Oral Oral  SpO2: 100% 100% 97% 99%  Weight:      Height:       Body mass index is 26.73 kg/m.  Physical Exam Constitutional:      Comments: Resting quietly in bed.   Cardiovascular:     Rate and Rhythm: Normal rate and regular rhythm.  Abdominal:     Palpations: Abdomen is soft.     Tenderness: There is no abdominal tenderness.  Skin:    General: Skin is warm and dry.  Neurological:     Mental Status: He is alert and oriented to person, place, and time.     Lab Results Lab Results  Component Value Date   WBC 5.3 08/13/2020   HGB 14.6 08/13/2020   HCT 43.8 08/13/2020   MCV 86.7 08/13/2020   PLT 203 08/13/2020    Lab Results  Component Value Date   CREATININE 0.87 08/13/2020   BUN 5 (L) 08/13/2020   NA 135 08/13/2020   K 3.6 08/13/2020   CL 99 08/13/2020   CO2 28 08/13/2020    Lab Results  Component Value Date   ALT 15 08/12/2020   AST 18 08/12/2020   ALKPHOS 78 08/12/2020   BILITOT 0.5 08/12/2020     Microbiology: Recent Results (from the past 240 hour(s))  SARS Coronavirus 2  by RT PCR (hospital order, performed in [place] hospital lab) Nasopharyngeal Nasopharyngeal Swab     Status: Abnormal   Collection Time: 08/11/20  7:48 AM   Specimen: Nasopharyngeal Swab  Result Value Ref Range Status   SARS Coronavirus 2 POSITIVE (A) NEGATIVE Final    Comment: emailed L. Berdik RN 12:10 08/11/20 ([email or web address]) (NOTE) SARS-CoV-2 target nucleic acids are DETECTED  SARS-CoV-2 RNA is generally detectable in upper respiratory specimens  during the acute phase of infection.  Positive results are indicative  of the presence of the identified virus, but do not rule out bacterial infection or co-infection with other pathogens not detected by the test.  Clinical correlation with patient history and  other diagnostic information is necessary to determine patient infection status.  The expected result is negative.  Fact Sheet for Patients:   StrictlyIdeas.no   Fact Sheet for Healthcare Providers:   BankingDealers.co.za    This test is not yet approved or cleared by the [place] FDA and  has been authorized for detection and/or diagnosis of SARS-CoV-2 by FDA under an Emergency Use Authorization (EUA).  This EUA will remain in effect (meaning this test can be used) for the duration of  the  COVID-19 declaration under Section 564(b)(1) of the Act, 21 U.S.C. section 360-bbb-3(b)(1), unless the authorization is terminated or revoked sooner.  Performed at [facility], [place] [address] [place], [place] [address]   Aerobic/Anaerobic Culture (surgical/deep wound)     Status: None (Preliminary result)   Collection Time: 08/11/20  7:31 PM   Specimen: PATH Soft tissue  Result Value Ref Range Status   Specimen Description ABSCESS  Final   Special Requests SOFT TISSUE SAMPLE A  Final   Gram Stain   Final    ABUNDANT WBC PRESENT, PREDOMINANTLY PMN MODERATE GRAM POSITIVE COCCI Performed at [facility], [address], [place], [place] [address]    Culture   Final    FEW METHICILLIN RESISTANT STAPHYLOCOCCUS AUREUS MODERATE GROUP A STREP (S.PYOGENES) ISOLATED Beta hemolytic streptococci are predictably susceptible to penicillin and other beta lactams. Susceptibility testing not routinely performed. NO ANAEROBES ISOLATED; CULTURE IN PROGRESS FOR 5 DAYS    Report Status PENDING  Incomplete   Organism ID, Bacteria METHICILLIN RESISTANT STAPHYLOCOCCUS AUREUS  Final      Susceptibility   Methicillin resistant staphylococcus aureus - MIC*    CIPROFLOXACIN >=8 RESISTANT Resistant     ERYTHROMYCIN >=8 RESISTANT Resistant     GENTAMICIN <=0.5 SENSITIVE Sensitive     OXACILLIN >=4 RESISTANT Resistant     TETRACYCLINE <=1 SENSITIVE Sensitive  VANCOMYCIN <=0.5 SENSITIVE Sensitive     TRIMETH/SULFA >=320 RESISTANT Resistant     CLINDAMYCIN >=8 RESISTANT Resistant     RIFAMPIN <=0.5 SENSITIVE Sensitive     Inducible Clindamycin NEGATIVE Sensitive     * FEW METHICILLIN RESISTANT STAPHYLOCOCCUS AUREUS  Aerobic/Anaerobic Culture (surgical/deep wound)     Status: None (Preliminary result)   Collection Time: 08/11/20  7:34 PM   Specimen: Bone; Tissue  Result Value Ref Range Status   Specimen Description ABSCESS  Final   Special Requests BONE SAMPLE B  Final   Gram Stain   Final    RARE WBC PRESENT, PREDOMINANTLY PMN NO ORGANISMS SEEN Performed at [facility], [place] [address] [place], [place] [address]    Culture   Final    RARE METHICILLIN RESISTANT STAPHYLOCOCCUS AUREUS NO ANAEROBES ISOLATED; CULTURE IN PROGRESS FOR 5 DAYS    Report Status PENDING  Incomplete   Organism ID, Bacteria METHICILLIN RESISTANT STAPHYLOCOCCUS AUREUS  Final      Susceptibility   Methicillin resistant staphylococcus aureus - MIC*    CIPROFLOXACIN >=8 RESISTANT Resistant     ERYTHROMYCIN >=8 RESISTANT Resistant     GENTAMICIN <=0.5 SENSITIVE Sensitive     OXACILLIN >=4 RESISTANT Resistant     TETRACYCLINE <=1 SENSITIVE  Sensitive     VANCOMYCIN <=0.5 SENSITIVE Sensitive     TRIMETH/SULFA >=320 RESISTANT Resistant     CLINDAMYCIN >=8 RESISTANT Resistant     RIFAMPIN <=0.5 SENSITIVE Sensitive     Inducible Clindamycin NEGATIVE Sensitive     * RARE METHICILLIN RESISTANT STAPHYLOCOCCUS AUREUS     Janene Madeira, MSN, NP-C [facility] [email or web address] Pager: [phone] Office: [phone] [place]: [phone]

## 2020-08-15 NOTE — Progress Notes (Signed)
PROGRESS NOTE                                                                                                                                                                                                             Patient Demographics:    Kenneth Faulkner, is a 44 y.o. male, DOB - February 15, 1976, WUJ:[id number]  Outpatient Primary MD for the patient is Patient, [name]   Admit date - 08/11/2020   LOS - 4  Chief Complaint  Patient presents with  . Hand Pain       Brief Narrative: Patient is a 44 y.o. male with no past medical history-who had a wood splinter puncture to his left thumb-subsequently had pain and swelling for approximately 10-12 days before seeking medical attention in the emergency room on 9/10-found to have soft tissue infection with underlying osteomyelitis and incidental COVID-19 infection.  Admitted to the hospitalist service for further evaluation and treatment.  COVID-19 vaccinated status: Vaccinated  Significant Events: 9/10>> Admit to [facility] for left thumb cellulitis with soft tissue infection-incidental COVID-19 infection  Significant studies: 9/11>>Chest x-ray: No acute cardiopulmonary process 9/12>>Operative procedure #1 irrigation debridement and bony curettage left thumb distal phalanx and proximal phalanx at the IP joint which has been rendered incompetent due to osteomyelitis.  Patient has complete joint destruction.  #2 flexor tenolysis tenosynovectomy secondary to infectious flexor tenosynovitis #3 extensor tenolysis tendon synovectomy and partial tenotomy secondary to chronic extensor infectious tenosynovitis   COVID-19 medications: None  Antibiotics: Vancomycin: 9/10>>9/12 Cefepime: 9/10>>9/12 IV ancef: 9/12  Microbiology data: 9/19>> wound culture/intraoperative culture: MRSA  Procedures: 9/10>> irrigation and debridement abscess left thumb, bony debridement and resection of  destroyed DIP  Consults: None  DVT prophylaxis: enoxaparin (LOVENOX) injection 40 mg Start: 08/12/20 1100 SCDs Start: 08/11/20 1756  Prophylactic Lovenox    Subjective:    Kenneth Faulkner today is very irritable, argumentative, he refused to answer questions .   Assessment  & Plan :   Acute Left thumb osteomyelitis:  -Postop care deferred to hand surgery -Status post incision and drainage and washout on 9/10 and 9/12, no further washout and debridement currently required per hand surgery -ID has been consulted by hand surgery for further antibiotic recommendations, initially on vancomycin and cefepime, transitioned to IV Ancef 9/12, intraoperative culture showing MRSA, and moderate GAS, ID has been consulted,  antibiotic has been changed to IV vancomycin, he will need total of 6 weeks treatment of IV antibiotics. -PICC line inserted 9/13. -Have discussed with Dr. Amanda Pea, patient will be able to be discharged on IV antibiotics, with outpatient follow-up, patient given instructions by him, he will follow as an outpatient in 8 to 10 days.  COVID-19 infection:  Asymptomatic-fully vaccinated with J&J vaccine couple months ago.  CRP/D-dimer minimally elevated but suspect this is from surgical issues rather than COVID-19 issues.  -Received monoclonal antibody 9/12.   COVID-19 Labs: No results for input(s): DDIMER, FERRITIN, LDH, CRP in the last 72 hours.  No results found for: BNP  No results for input(s): PROCALCITON in the last 168 hours.  Lab Results  Component Value Date   SARSCOV2NAA POSITIVE (A) 08/11/2020    Tobacco abuse: Continue transdermal nicotine   ABG:    Component Value Date/Time   TCO2 24 03/26/2020 1619    Vent Settings: N/A  Condition - Stable  Family Communication  : Patient update family himself.  Code Status :  Full Code  Diet :  Diet Order            Diet regular Room service appropriate? Yes; Fluid consistency: Thin  Diet effective now                    Disposition Plan  :   Status is: Inpatient  Remains inpatient appropriate because:IV treatments appropriate due to intensity of illness or inability to take PO   Dispo: The patient is from: Home              Anticipated d/c is to: Home              Anticipated d/c date is: 1 day              Patient currently is medically stable to d/c.>  Be discharged tomorrow after home health has been arranged.   Barriers to discharge: Hypoxia requiring O2 supplementation/complete 5 days of IV Remdesivir  Antimicorbials  :    Anti-infectives (From admission, onward)   Start     Dose/Rate Route Frequency Ordered Stop   08/15/20 1500  vancomycin (VANCOREADY) IVPB 1250 mg/250 mL  Status:  Discontinued        1,250 mg 166.7 mL/hr over 90 Minutes Intravenous Every 12 hours 08/15/20 0922 08/15/20 1016   08/15/20 1400  vancomycin (VANCOCIN) IVPB 1000 mg/200 mL premix        1,000 mg 200 mL/hr over 60 Minutes Intravenous Every 8 hours 08/15/20 1016     08/14/20 2200  vancomycin (VANCOCIN) IVPB 1000 mg/200 mL premix  Status:  Discontinued        1,000 mg 200 mL/hr over 60 Minutes Intravenous Every 8 hours 08/14/20 1314 08/15/20 0922   08/14/20 1400  vancomycin (VANCOREADY) IVPB 1500 mg/300 mL        1,500 mg 150 mL/hr over 120 Minutes Intravenous  Once 08/14/20 1314 08/14/20 1734   08/13/20 1400  ceFAZolin (ANCEF) IVPB 2g/100 mL premix  Status:  Discontinued        2 g 200 mL/hr over 30 Minutes Intravenous Every 8 hours 08/13/20 1355 08/14/20 1314   08/11/20 1800  vancomycin (VANCOCIN) IVPB 1000 mg/200 mL premix  Status:  Discontinued        1,000 mg 200 mL/hr over 60 Minutes Intravenous Every 8 hours 08/11/20 0849 08/13/20 1343   08/11/20 1800  ceFEPIme (MAXIPIME) 2 g in sodium chloride 0.9 %  100 mL IVPB  Status:  Discontinued        2 g 200 mL/hr over 30 Minutes Intravenous Every 8 hours 08/11/20 0849 08/13/20 1343   08/11/20 0900  vancomycin (VANCOREADY) IVPB 1500 mg/300 mL         1,500 mg 150 mL/hr over 120 Minutes Intravenous  Once 08/11/20 0849 08/11/20 1504   08/11/20 0900  ceFEPIme (MAXIPIME) 2 g in sodium chloride 0.9 % 100 mL IVPB        2 g 200 mL/hr over 30 Minutes Intravenous  Once 08/11/20 0849 08/11/20 1030   08/11/20 0745  clindamycin (CLEOCIN) IVPB 600 mg  Status:  Discontinued        600 mg 100 mL/hr over 30 Minutes Intravenous  Once 08/11/20 0744 08/11/20 0850      Inpatient Medications  Scheduled Meds: . vitamin C  1,000 mg Oral Daily  . Chlorhexidine Gluconate Cloth  6 each Topical Daily  . enoxaparin (LOVENOX) injection  40 mg Subcutaneous Q24H  . nicotine  21 mg Transdermal Daily   Continuous Infusions: . sodium chloride Stopped (08/12/20 1732)  . vancomycin 1,000 mg (08/15/20 1347)   PRN Meds:.HYDROcodone-acetaminophen, HYDROmorphone (DILAUDID) injection, methocarbamol, ondansetron **OR** ondansetron (ZOFRAN) IV, oxyCODONE, sodium chloride flush   Time Spent in minutes  25  See all Orders from today for further details   Huey Bienenstock M.D on 08/15/2020 at 3:53 PM  To page go to [email or web address] - use universal password  [facility] -  Office  [phone]    Objective:   Vitals:   08/14/20 0744 08/14/20 1309 08/14/20 1900 08/15/20 0500  BP: 135/72 130/72 138/79 133/76  Pulse: 65 75 (!) 52 60  Resp: 17 17 20 19   Temp: 98.2 F (36.8 C) 98.4 F (36.9 C) 98.5 F (36.9 C) 98.5 F (36.9 C)  TempSrc: Oral Oral Oral Oral  SpO2: 100% 100% 97% 99%  Weight:      Height:        Wt Readings from Last 3 Encounters:  08/12/20 77.4 kg  03/26/20 81.6 kg  02/06/19 79.4 kg     Intake/Output Summary (Last 24 hours) at 08/15/2020 1553 Last data filed at 08/15/2020 0600 Gross per 24 hour  Intake 849.36 ml  Output --  Net 849.36 ml     Physical Exam  Awake Alert, Oriented X 3, No new F.N deficits. Symmetrical chest wall movement bilaterally. Left hand bandaged and wrapped .   Data Review:    CBC Recent Labs   Lab 08/11/20 0203 08/12/20 0250 08/13/20 0500  WBC 7.8 4.3 5.3  HGB 13.3 13.3 14.6  HCT 40.2 39.7 43.8  PLT 286 223 203  MCV 88.2 88.2 86.7  MCH 29.2 29.6 28.9  MCHC 33.1 33.5 33.3  RDW 12.4 12.4 12.3  LYMPHSABS 1.1  --   --   MONOABS 0.5  --   --   EOSABS 0.4  --   --   BASOSABS 0.0  --   --     Chemistries  Recent Labs  Lab 08/11/20 0203 08/12/20 0250 08/13/20 0500  NA 138 138 135  K 3.4* 3.5 3.6  CL 103 103 99  CO2 23 27 28   GLUCOSE 127* 123* 116*  BUN 10 5* 5*  CREATININE 0.90 0.83 0.87  CALCIUM 8.8* 8.2* 8.6*  AST 22 18  --   ALT 20 15  --   ALKPHOS 80 78  --   BILITOT 0.3 0.5  --    ------------------------------------------------------------------------------------------------------------------  No results for input(s): CHOL, HDL, LDLCALC, TRIG, CHOLHDL, LDLDIRECT in the last 72 hours.  No results found for: HGBA1C ------------------------------------------------------------------------------------------------------------------ No results for input(s): TSH, T4TOTAL, T3FREE, THYROIDAB in the last 72 hours.  Invalid input(s): FREET3 ------------------------------------------------------------------------------------------------------------------ No results for input(s): VITAMINB12, FOLATE, FERRITIN, TIBC, IRON, RETICCTPCT in the last 72 hours.  Coagulation profile No results for input(s): INR, PROTIME in the last 168 hours.  No results for input(s): DDIMER in the last 72 hours.  Cardiac Enzymes No results for input(s): CKMB, TROPONINI, MYOGLOBIN in the last 168 hours.  Invalid input(s): CK ------------------------------------------------------------------------------------------------------------------ No results found for: BNP  Micro Results Recent Results (from the past 240 hour(s))  SARS Coronavirus 2 by RT PCR (hospital order, performed in [facility] hospital lab) Nasopharyngeal Nasopharyngeal Swab     Status: Abnormal   Collection Time:  08/11/20  7:48 AM   Specimen: Nasopharyngeal Swab  Result Value Ref Range Status   SARS Coronavirus 2 POSITIVE (A) NEGATIVE Final    Comment: emailed L. Berdik RN 12:10 08/11/20 ([email or web address]) (NOTE) SARS-CoV-2 target nucleic acids are DETECTED  SARS-CoV-2 RNA is generally detectable in upper respiratory specimens  during the acute phase of infection.  Positive results are indicative  of the presence of the identified virus, but do not rule out bacterial infection or co-infection with other pathogens not detected by the test.  Clinical correlation with patient history and  other diagnostic information is necessary to determine patient infection status.  The expected result is negative.  Fact Sheet for Patients:   BoilerBrush.com.cy   Fact Sheet for Healthcare Providers:   https://pope.com/    This test is not yet approved or cleared by the [place] FDA and  has been authorized for detection and/or diagnosis of SARS-CoV-2 by FDA under an Emergency Use Authorization (EUA).  This EUA will remain in effect (meaning this test can be used) for the duration of  the  COVID-19 declaration under Section 564(b)(1) of the Act, 21 U.S.C. section 360-bbb-3(b)(1), unless the authorization is terminated or revoked sooner.  Performed at [facility], [address] [place], [place] [address]   Aerobic/Anaerobic Culture (surgical/deep wound)     Status: None (Preliminary result)   Collection Time: 08/11/20  7:31 PM   Specimen: PATH Soft tissue  Result Value Ref Range Status   Specimen Description ABSCESS  Final   Special Requests SOFT TISSUE SAMPLE A  Final   Gram Stain   Final    ABUNDANT WBC PRESENT, PREDOMINANTLY PMN MODERATE GRAM POSITIVE COCCI Performed at [facility], [address], [place], [place] [address]    Culture   Final    FEW METHICILLIN RESISTANT STAPHYLOCOCCUS AUREUS MODERATE GROUP A STREP (S.PYOGENES)  ISOLATED Beta hemolytic streptococci are predictably susceptible to penicillin and other beta lactams. Susceptibility testing not routinely performed. NO ANAEROBES ISOLATED; CULTURE IN PROGRESS FOR 5 DAYS    Report Status PENDING  Incomplete   Organism ID, Bacteria METHICILLIN RESISTANT STAPHYLOCOCCUS AUREUS  Final      Susceptibility   Methicillin resistant staphylococcus aureus - MIC*    CIPROFLOXACIN >=8 RESISTANT Resistant     ERYTHROMYCIN >=8 RESISTANT Resistant     GENTAMICIN <=0.5 SENSITIVE Sensitive     OXACILLIN >=4 RESISTANT Resistant     TETRACYCLINE <=1 SENSITIVE Sensitive     VANCOMYCIN <=0.5 SENSITIVE Sensitive     TRIMETH/SULFA >=320 RESISTANT Resistant     CLINDAMYCIN >=8 RESISTANT Resistant     RIFAMPIN <=0.5 SENSITIVE Sensitive  Inducible Clindamycin NEGATIVE Sensitive     * FEW METHICILLIN RESISTANT STAPHYLOCOCCUS AUREUS  Aerobic/Anaerobic Culture (surgical/deep wound)     Status: None (Preliminary result)   Collection Time: 08/11/20  7:34 PM   Specimen: Bone; Tissue  Result Value Ref Range Status   Specimen Description ABSCESS  Final   Special Requests BONE SAMPLE B  Final   Gram Stain   Final    RARE WBC PRESENT, PREDOMINANTLY PMN NO ORGANISMS SEEN Performed at [facility], [address] [place], [place] [address]    Culture   Final    RARE METHICILLIN RESISTANT STAPHYLOCOCCUS AUREUS NO ANAEROBES ISOLATED; CULTURE IN PROGRESS FOR 5 DAYS    Report Status PENDING  Incomplete   Organism ID, Bacteria METHICILLIN RESISTANT STAPHYLOCOCCUS AUREUS  Final      Susceptibility   Methicillin resistant staphylococcus aureus - MIC*    CIPROFLOXACIN >=8 RESISTANT Resistant     ERYTHROMYCIN >=8 RESISTANT Resistant     GENTAMICIN <=0.5 SENSITIVE Sensitive     OXACILLIN >=4 RESISTANT Resistant     TETRACYCLINE <=1 SENSITIVE Sensitive     VANCOMYCIN <=0.5 SENSITIVE Sensitive     TRIMETH/SULFA >=320 RESISTANT Resistant     CLINDAMYCIN >=8 RESISTANT Resistant      RIFAMPIN <=0.5 SENSITIVE Sensitive     Inducible Clindamycin NEGATIVE Sensitive     * RARE METHICILLIN RESISTANT STAPHYLOCOCCUS AUREUS    Radiology Reports DG Chest Port 1V same Day  Result Date: 08/12/2020 CLINICAL DATA:  Sob according to order, pt also confirms sob today. EXAM: PORTABLE CHEST 1 VIEW COMPARISON:  None. FINDINGS: Normal mediastinum and cardiac silhouette. Normal pulmonary vasculature. No evidence of effusion, infiltrate, or pneumothorax. No acute bony abnormality. IMPRESSION: No acute cardiopulmonary process. Electronically Signed   By: Genevive BiStewart  Edmunds M.D.   On: 08/12/2020 10:14   DG Hand Complete Left  Result Date: 08/11/2020 CLINICAL DATA:  Finger fractures.  Infected left thumb. EXAM: LEFT HAND - COMPLETE 3+ VIEW COMPARISON:  04/28/2020. FINDINGS: Extensive soft tissue swelling noted about the left thumb. Erosive changes noted about the distal aspect of the proximal phalanx and proximal aspect of distal phalanx of the thumb. These findings suggest osteomyelitis and possible septic arthritis. Associated fracture of the base of the distal phalanx of the left thumb noted. Nonunited angulated comminuted fracture of the proximal phalanx of the left second digit noted. Tiny radiopaque foreign bodies in the soft tissues adjacent to the left thumb cannot be excluded. IMPRESSION: 1. Severe soft tissue swelling left thumb. Erosive changes noted about the distal aspect of the proximal phalanx and proximal aspect of the distal phalanx of the left thumb. These findings suggest osteomyelitis and possible septic arthritis. Associated fracture of the base of the distal phalanx of the left thumb noted. Tiny radiopaque foreign bodies in the soft tissues adjacent to the left thumb cannot be excluded. 2. Nonunited angulated comminuted fracture of the proximal phalanx of the left second digit noted Electronically Signed   By: Maisie Fushomas  Register   On: 08/11/2020 09:02   DG Finger Thumb Left  Result  Date: 08/11/2020 CLINICAL DATA:  Rule out foreign body. Patient presents with wound to left thumb for 1 week. Thumb is rib swollen, red and warm to touch. Reported splinter 8 days ago. EXAM: LEFT THUMB 2+V COMPARISON:  Hand radiographs 04/28/2020 FINDINGS: Marked soft tissue swelling of the thumb. There is a minimally displaced intra-articular fracture through the lateral base of the distal phalanx. There is surrounding lucency of  the distal phalanx and the adjacent head of the proximal phalanx with associated erosive change along the volar aspect, best seen on the lateral. Punctate linear densities in the soft tissues of the volar and dorsal aspect of the thumb. 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 IMPRESSION: 1. Acute mildly displaced intra-articular fracture through the lateral base of the distal phalanx of the thumb. 2. Surrounding osseous lucency of the distal phalanx of the thumb and erosive change involving the adjacent head of the proximal phalanx, which is compatible with osteomyelitis. 3. Surrounding soft tissue swelling, compatible with cellulitis. Punctate linear densities along the volar and dorsal soft tissues of the thumb, which may represent small soft tissue foreign bodies or areas of mineralization. 4. 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 Dedicated hand radiographs could further evaluate if clinically indicated. Electronically Signed   By: Feliberto Harts MD   On: 08/11/2020 08:28   [place] EKG SITE RITE  Result Date: 08/13/2020 If Site Rite image not attached, placement could not be confirmed due to current cardiac rhythm.

## 2020-08-15 NOTE — Plan of Care (Signed)
  Problem: Education: Goal: Knowledge of General Education information will improve Description: Including pain rating scale, medication(s)/side effects and non-pharmacologic comfort measures Outcome: Progressing   Problem: Health Behavior/Discharge Planning: Goal: Ability to manage health-related needs will improve Outcome: Progressing   Problem: Clinical Measurements: Goal: Ability to maintain clinical measurements within normal limits will improve Outcome: Progressing Goal: Will remain free from infection Outcome: Progressing Goal: Diagnostic test results will improve Outcome: Progressing Goal: Respiratory complications will improve Outcome: Progressing Goal: Cardiovascular complication will be avoided Outcome: Progressing   Problem: Activity: Goal: Risk for activity intolerance will decrease Outcome: Progressing   Problem: Nutrition: Goal: Adequate nutrition will be maintained Outcome: Progressing   Problem: Coping: Goal: Level of anxiety will decrease Outcome: Progressing   Problem: Elimination: Goal: Will not experience complications related to bowel motility Outcome: Progressing Goal: Will not experience complications related to urinary retention Outcome: Progressing   Problem: Pain Managment: Goal: General experience of comfort will improve Outcome: Progressing   Problem: Safety: Goal: Ability to remain free from injury will improve Outcome: Progressing   Problem: Skin Integrity: Goal: Risk for impaired skin integrity will decrease Outcome: Progressing   Problem: Education: Goal: Knowledge of risk factors and measures for prevention of condition will improve Outcome: Progressing   Problem: Coping: Goal: Psychosocial and spiritual needs will be supported Outcome: Progressing   Problem: Respiratory: Goal: Will maintain a patent airway Outcome: Progressing Goal: Complications related to the disease process, condition or treatment will be avoided or  minimized Outcome: Progressing   Problem: Education: Goal: Required Educational Video(s) Outcome: Progressing   Problem: Skin Integrity: Goal: Demonstration of wound healing without infection will improve Outcome: Progressing

## 2020-08-16 DIAGNOSIS — M86142 Other acute osteomyelitis, left hand: Principal | ICD-10-CM

## 2020-08-16 LAB — BASIC METABOLIC PANEL
Anion gap: 8 (ref 5–15)
BUN: 11 mg/dL (ref 6–20)
CO2: 31 mmol/L (ref 22–32)
Calcium: 9.2 mg/dL (ref 8.9–10.3)
Chloride: 100 mmol/L (ref 98–111)
Creatinine, Ser: 0.89 mg/dL (ref 0.61–1.24)
GFR calc Af Amer: >60 mL/min (ref >60)
GFR calc non Af Amer: >60 mL/min (ref >60)
Glucose, Bld: 97 mg/dL (ref 70–99)
Potassium: 4.8 mmol/L (ref 3.5–5.1)
Sodium: 139 mmol/L (ref 135–145)

## 2020-08-16 LAB — VANCOMYCIN, TROUGH: Vancomycin Tr: 17 ug/mL (ref 15–20)

## 2020-08-16 MED ORDER — VANCOMYCIN IV (FOR PTA / DISCHARGE USE ONLY): 1250.0000 mg | Freq: Three times a day (TID) | INTRAVENOUS | 0 refills | Status: AC

## 2020-08-16 MED ORDER — ACETAMINOPHEN 325 MG PO TABS: 650.0000 mg | ORAL_TABLET | Freq: Four times a day (QID) | ORAL | Status: AC | PRN

## 2020-08-16 MED ORDER — OXYCODONE HCL 10 MG PO TABS
10.0000 mg | ORAL_TABLET | Freq: Four times a day (QID) | ORAL | 0 refills | Status: AC | PRN
Start: 2020-08-16 — End: ?

## 2020-08-16 NOTE — Discharge Instructions (Signed)
Please make sure to keep your bandage clean and dry.  You will see Dr. Amanda Pea in 10 to 14 days and he will remove the bandage.  Please keep it clean and dry until that time.  If it becomes damp dirty or have problems you can change it with the dressing supplies Dr. Amanda Pea gave you.  Please let healthy.  Please avoid smoking.  Should any problems occur notify us immediately through Dr. Carlos Levering office.       Person Under Monitoring Name: Kenneth Faulkner  Location: [address] [place] [address]   Infection Prevention Recommendations for Individuals Confirmed to have, or Being Evaluated for, 2019 Novel Coronavirus (COVID-19) Infection Who Receive Care at Home  Individuals who are confirmed to have, or are being evaluated for, COVID-19 should follow the prevention steps below until a healthcare provider or local or state health department says they can return to normal activities.  Stay home except to get medical care You should restrict activities outside your home, except for getting medical care. Do not go to work, school, or public areas, and do not use public transportation or taxis.  Call ahead before visiting your doctor Before your medical appointment, call the healthcare provider and tell them that you have, or are being evaluated for, COVID-19 infection. This will help the healthcare provider's office take steps to keep other people from getting infected. Ask your healthcare provider to call the local or state health department.  Monitor your symptoms Seek prompt medical attention if your illness is worsening (e.g., difficulty breathing). Before going to your medical appointment, call the healthcare provider and tell them that you have, or are being evaluated for, COVID-19 infection. Ask your healthcare provider to call the local or state health department.  Wear a facemask You should wear a facemask that covers your nose and mouth when you are in the same  room with other people and when you visit a healthcare provider. People who live with or visit you should also wear a facemask while they are in the same room with you.  Separate yourself from other people in your home As much as possible, you should stay in a different room from other people in your home. Also, you should use a separate bathroom, if available.  Avoid sharing household items You should not share dishes, drinking glasses, cups, eating utensils, towels, bedding, or other items with other people in your home. After using these items, you should wash them thoroughly with soap and water.  Cover your coughs and sneezes Cover your mouth and nose with a tissue when you cough or sneeze, or you can cough or sneeze into your sleeve. Throw used tissues in a lined trash can, and immediately wash your hands with soap and water for at least 20 seconds or use an alcohol-based hand rub.  Wash your [employer] your hands often and thoroughly with soap and water for at least 20 seconds. You can use an alcohol-based hand sanitizer if soap and water are not available and if your hands are not visibly dirty. Avoid touching your eyes, nose, and mouth with unwashed hands.   Prevention Steps for Caregivers and Household Members of Individuals Confirmed to have, or Being Evaluated for, COVID-19 Infection Being Cared for in the Home  If you live with, or provide care at home for, a person confirmed to have, or being evaluated for, COVID-19 infection please follow these guidelines to prevent infection:  Follow healthcare provider's instructions Make sure  that you understand and can help the patient follow any healthcare provider instructions for all care.  Provide for the patient's basic needs You should help the patient with basic needs in the home and provide support for getting groceries, prescriptions, and other personal needs.  Monitor the patient's symptoms If they are getting sicker,  call his or her medical provider and tell them that the patient has, or is being evaluated for, COVID-19 infection. This will help the healthcare provider's office take steps to keep other people from getting infected. Ask the healthcare provider to call the local or state health department.  Limit the number of people who have contact with the patient  If possible, have only one caregiver for the patient.  Other household members should stay in another home or place of residence. If this is not possible, they should stay  in another room, or be separated from the patient as much as possible. Use a separate bathroom, if available.  Restrict visitors who do not have an essential need to be in the home.  Keep older adults, very young children, and other sick people away from the patient Keep older adults, very young children, and those who have compromised immune systems or chronic health conditions away from the patient. This includes people with chronic heart, lung, or kidney conditions, diabetes, and cancer.  Ensure good ventilation Make sure that shared spaces in the home have good air flow, such as from an air conditioner or an opened window, weather permitting.  Wash your hands often  Wash your hands often and thoroughly with soap and water for at least 20 seconds. You can use an alcohol based hand sanitizer if soap and water are not available and if your hands are not visibly dirty.  Avoid touching your eyes, nose, and mouth with unwashed hands.  Use disposable paper towels to dry your hands. If not available, use dedicated cloth towels and replace them when they become wet.  Wear a facemask and gloves  Wear a disposable facemask at all times in the room and gloves when you touch or have contact with the patient's blood, body fluids, and/or secretions or excretions, such as sweat, saliva, sputum, nasal mucus, vomit, urine, or feces.  Ensure the mask fits over your nose and mouth  tightly, and do not touch it during use.  Throw out disposable facemasks and gloves after using them. Do not reuse.  Wash your hands immediately after removing your facemask and gloves.  If your personal clothing becomes contaminated, carefully remove clothing and launder. Wash your hands after handling contaminated clothing.  Place all used disposable facemasks, gloves, and other waste in a lined container before disposing them with other household waste.  Remove gloves and wash your hands immediately after handling these items.  Do not share dishes, glasses, or other household items with the patient  Avoid sharing household items. You should not share dishes, drinking glasses, cups, eating utensils, towels, bedding, or other items with a patient who is confirmed to have, or being evaluated for, COVID-19 infection.  After the person uses these items, you should wash them thoroughly with soap and water.  Wash laundry thoroughly  Immediately remove and wash clothes or bedding that have blood, body fluids, and/or secretions or excretions, such as sweat, saliva, sputum, nasal mucus, vomit, urine, or feces, on them.  Wear gloves when handling laundry from the patient.  Read and follow directions on labels of laundry or clothing items and detergent. In general,  wash and dry with the warmest temperatures recommended on the label.  Clean all areas the individual has used often  Clean all touchable surfaces, such as counters, tabletops, doorknobs, bathroom fixtures, toilets, phones, keyboards, tablets, and bedside tables, every day. Also, clean any surfaces that may have blood, body fluids, and/or secretions or excretions on them.  Wear gloves when cleaning surfaces the patient has come in contact with.  Use a diluted bleach solution (e.g., dilute bleach with 1 part bleach and 10 parts water) or a household disinfectant with a label that says EPA-registered for coronaviruses. To make a bleach  solution at home, add 1 tablespoon of bleach to 1 quart (4 cups) of water. For a larger supply, add  cup of bleach to 1 gallon (16 cups) of water.  Read labels of cleaning products and follow recommendations provided on product labels. Labels contain instructions for safe and effective use of the cleaning product including precautions you should take when applying the product, such as wearing gloves or eye protection and making sure you have good ventilation during use of the product.  Remove gloves and wash hands immediately after cleaning.  Monitor yourself for signs and symptoms of illness Caregivers and household members are considered close contacts, should monitor their health, and will be asked to limit movement outside of the home to the extent possible. Follow the monitoring steps for close contacts listed on the symptom monitoring form.   ? If you have additional questions, contact your local health department or call the epidemiologist on call at [phone] (available 24/7). ? This guidance is subject to change. For the most up-to-date guidance from [facility], please refer to their website: [email or web address]

## 2020-08-16 NOTE — Discharge Summary (Signed)
PATIENT DETAILS Name: Kenneth Faulkner Age: 44 y.o. Sex: male Date of Birth: [birth year] MRN: [id number]. Admitting Physician: Mendel Corning, MD NWG:[id number], [name]  Admit Date: 08/11/2020 Discharge date: 08/16/2020  Recommendations for Outpatient Follow-up:  1. Follow up with PCP in 1-2 weeks 2. Please obtain CMP/CBC in one week 3. Ensure follow-up with orthopedics and infectious disease  Admitted From:  Home   Disposition: Home with home health [place]:  Yes  Equipment/Devices: None  Discharge Condition: Stable  CODE STATUS: FULL CODE  Diet recommendation:  Diet Order            Diet general           Diet regular Room service appropriate? Yes; Fluid consistency: Thin  Diet effective now                  Brief Narrative: Patient is a 44 y.o. male with no past medical history-who had a wood splinter puncture to his left thumb-subsequently had pain and swelling for approximately 10-12 days before seeking medical attention in the emergency room on 9/10-found to have soft tissue infection with underlying osteomyelitis and incidental COVID-19 infection.  Admitted to the hospitalist service for further evaluation and treatment.  COVID-19 vaccinated status: Vaccinated  Significant Events: 9/10>> Admit to [facility] for left thumb cellulitis with soft tissue infection-incidental COVID-19 infection  Significant studies: 9/11>>Chest x-ray: No acute cardiopulmonary process  COVID-19 medications: None  Antibiotics: Vancomycin: 9/10>>9/12, 9/13>> Cefepime: 9/10>>9/12 IV ancef: 9/12  Microbiology data: 9/19>> wound culture/intraoperative culture: MRSA  Procedures: 9/10>> irrigation and debridement abscess left thumb, bony debridement and resection of destroyed DIP 9/12>>Operative procedure #1 irrigation debridement and bony curettage left thumb distal phalanx and proximal phalanx at the IP joint which has been rendered incompetent due  to osteomyelitis. Patient has complete joint destruction. #2 flexor tenolysis tenosynovectomy secondary to infectious flexor tenosynovitis #3 extensor tenolysis tendon synovectomy and partial tenotomy secondary to chronic extensor infectious tenosynovitis  Consults: ID, hand surgery  Brief Hospital Course: Acute Left thumb osteomyelitis: Evaluated by hand surgery-underwent irrigation and debridement-intraoperative cultures positive for MRSA.  Infectious disease consulted-recommendations are for 6 weeks of IV antibiotics.  Patient will require close follow-up with infectious disease and hand surgery.  COVID-19 infection:  Asymptomatic-fully vaccinated with J&J vaccine couple months ago.  CRP/D-dimer minimally elevated but suspect this is from surgical issues rather than COVID-19 issues. Received monoclonal antibody 9/12.  COVID-19 Labs:  No results for input(s): DDIMER, FERRITIN, LDH, CRP in the last 72 hours.  Lab Results  Component Value Date   SARSCOV2NAA POSITIVE (A) 08/11/2020     Discharge Diagnoses:  Principal Problem:   Osteomyelitis ([place]), left thumb Active Problems:   Wound cellulitis   Nicotine use disorder   COVID-19 virus infection   Discharge Instructions:    Person Under Monitoring Name: Kenneth Faulkner  Location: [facility] [address]   Infection Prevention Recommendations for Individuals Confirmed to have, or Being Evaluated for, 2019 Novel Coronavirus (COVID-19) Infection Who Receive Care at Home  Individuals who are confirmed to have, or are being evaluated for, COVID-19 should follow the prevention steps below until a healthcare provider or local or state health department says they can return to normal activities.  Stay home except to get medical care You should restrict activities outside your home, except for getting medical care. Do not go to work, school, or public areas, and do not use public transportation or  taxis.  Call ahead before visiting your doctor Before your medical appointment, call the healthcare provider and tell them that you have, or are being evaluated for, COVID-19 infection. This will help the healthcare provider's office take steps to keep other people from getting infected. Ask your healthcare provider to call the local or state health department.  Monitor your symptoms Seek prompt medical attention if your illness is worsening (e.g., difficulty breathing). Before going to your medical appointment, call the healthcare provider and tell them that you have, or are being evaluated for, COVID-19 infection. Ask your healthcare provider to call the local or state health department.  Wear a facemask You should wear a facemask that covers your nose and mouth when you are in the same room with other people and when you visit a healthcare provider. People who live with or visit you should also wear a facemask while they are in the same room with you.  Separate yourself from other people in your home As much as possible, you should stay in a different room from other people in your home. Also, you should use a separate bathroom, if available.  Avoid sharing household items You should not share dishes, drinking glasses, cups, eating utensils, towels, bedding, or other items with other people in your home. After using these items, you should wash them thoroughly with soap and water.  Cover your coughs and sneezes Cover your mouth and nose with a tissue when you cough or sneeze, or you can cough or sneeze into your sleeve. Throw used tissues in a lined trash can, and immediately wash your hands with soap and water for at least 20 seconds or use an alcohol-based hand rub.  Wash your [facility] your hands often and thoroughly with soap and water for at least 20 seconds. You can use an alcohol-based hand sanitizer if soap and water are not available and if your hands are not visibly  dirty. Avoid touching your eyes, nose, and mouth with unwashed hands.   Prevention Steps for Caregivers and Household Members of Individuals Confirmed to have, or Being Evaluated for, COVID-19 Infection Being Cared for in the Home  If you live with, or provide care at home for, a person confirmed to have, or being evaluated for, COVID-19 infection please follow these guidelines to prevent infection:  Follow healthcare provider's instructions Make sure that you understand and can help the patient follow any healthcare provider instructions for all care.  Provide for the patient's basic needs You should help the patient with basic needs in the home and provide support for getting groceries, prescriptions, and other personal needs.  Monitor the patient's symptoms If they are getting sicker, call his or her medical provider and tell them that the patient has, or is being evaluated for, COVID-19 infection. This will help the healthcare provider's office take steps to keep other people from getting infected. Ask the healthcare provider to call the local or state health department.  Limit the number of people who have contact with the patient  If possible, have only one caregiver for the patient.  Other household members should stay in another home or place of residence. If this is not possible, they should stay  in another room, or be separated from the patient as much as possible. Use a separate bathroom, if available.  Restrict visitors who do not have an essential need to be in the home.  Keep older adults, very young children, and other sick people away from the  patient Keep older adults, very young children, and those who have compromised immune systems or chronic health conditions away from the patient. This includes people with chronic heart, lung, or kidney conditions, diabetes, and cancer.  Ensure good ventilation Make sure that shared spaces in the home have good air flow,  such as from an air conditioner or an opened window, weather permitting.  Wash your hands often  Wash your hands often and thoroughly with soap and water for at least 20 seconds. You can use an alcohol based hand sanitizer if soap and water are not available and if your hands are not visibly dirty.  Avoid touching your eyes, nose, and mouth with unwashed hands.  Use disposable paper towels to dry your hands. If not available, use dedicated cloth towels and replace them when they become wet.  Wear a facemask and gloves  Wear a disposable facemask at all times in the room and gloves when you touch or have contact with the patient's blood, body fluids, and/or secretions or excretions, such as sweat, saliva, sputum, nasal mucus, vomit, urine, or feces.  Ensure the mask fits over your nose and mouth tightly, and do not touch it during use.  Throw out disposable facemasks and gloves after using them. Do not reuse.  Wash your hands immediately after removing your facemask and gloves.  If your personal clothing becomes contaminated, carefully remove clothing and launder. Wash your hands after handling contaminated clothing.  Place all used disposable facemasks, gloves, and other waste in a lined container before disposing them with other household waste.  Remove gloves and wash your hands immediately after handling these items.  Do not share dishes, glasses, or other household items with the patient  Avoid sharing household items. You should not share dishes, drinking glasses, cups, eating utensils, towels, bedding, or other items with a patient who is confirmed to have, or being evaluated for, COVID-19 infection.  After the person uses these items, you should wash them thoroughly with soap and water.  Wash laundry thoroughly  Immediately remove and wash clothes or bedding that have blood, body fluids, and/or secretions or excretions, such as sweat, saliva, sputum, nasal mucus, vomit, urine,  or feces, on them.  Wear gloves when handling laundry from the patient.  Read and follow directions on labels of laundry or clothing items and detergent. In general, wash and dry with the warmest temperatures recommended on the label.  Clean all areas the individual has used often  Clean all touchable surfaces, such as counters, tabletops, doorknobs, bathroom fixtures, toilets, phones, keyboards, tablets, and bedside tables, every day. Also, clean any surfaces that may have blood, body fluids, and/or secretions or excretions on them.  Wear gloves when cleaning surfaces the patient has come in contact with.  Use a diluted bleach solution (e.g., dilute bleach with 1 part bleach and 10 parts water) or a household disinfectant with a label that says EPA-registered for coronaviruses. To make a bleach solution at home, add 1 tablespoon of bleach to 1 quart (4 cups) of water. For a larger supply, add  cup of bleach to 1 gallon (16 cups) of water.  Read labels of cleaning products and follow recommendations provided on product labels. Labels contain instructions for safe and effective use of the cleaning product including precautions you should take when applying the product, such as wearing gloves or eye protection and making sure you have good ventilation during use of the product.  Remove gloves and wash hands immediately after  cleaning.  Monitor yourself for signs and symptoms of illness Caregivers and household members are considered close contacts, should monitor their health, and will be asked to limit movement outside of the home to the extent possible. Follow the monitoring steps for close contacts listed on the symptom monitoring form.   ? If you have additional questions, contact your local health department or call the epidemiologist on call at [phone] (available 24/7). ? This guidance is subject to change. For the most up-to-date guidance from [facility], please refer to their  website: [email or web address].pl    Activity:  As tolerated   Discharge Instructions    [facility] pharmacist to adjust dose for Vancomycin, Aminoglycosides and other anti-infective therapies as requested by physician.   Complete by: As directed    [facility] to provide Cath Flo 62m   Complete by: As directed    Administer for PICC line occlusion and as ordered by physician for other access device issues.   Anaphylaxis Kit: Provided to treat any anaphylactic reaction to the medication being provided to the patient if First Dose or when requested by physician   Complete by: As directed    Epinephrine 14mml vial / amp: Administer 0.50m40m0.50ml102mubcutaneously once for moderate to severe anaphylaxis, nurse to call physician and pharmacy when reaction occurs and call 911 if needed for immediate care   Diphenhydramine 50mg30mIV vial: Administer 25-50mg 32mM PRN for first dose reaction, rash, itching, mild reaction, nurse to call physician and pharmacy when reaction occurs   Sodium Chloride 0.9% NS 500ml I45mdminister if needed for hypovolemic blood pressure drop or as ordered by physician after call to physician with anaphylactic reaction   Call MD for:  redness, tenderness, or signs of infection (pain, swelling, redness, odor or green/yellow discharge around incision site)   Complete by: As directed    Change dressing on IV access line weekly and PRN   Complete by: As directed    Diet general   Complete by: As directed    Discharge instructions   Complete by: As directed    Follow with Primary MD in 1-2 weeks  Please follow-up with the orthopedic surgeon and infectious disease clinic as instructed.  10 days of isolation from 08/11/2020  Please get a complete blood count and chemistry panel checked by your Primary MD at your next visit, and again as instructed by your Primary MD.  Get Medicines reviewed and  adjusted: Please take all your medications with you for your next visit with your Primary MD  Laboratory/radiological data: Please request your Primary MD to go over all hospital tests and procedure/radiological results at the follow up, please ask your Primary MD to get all Hospital records sent to his/her office.  In some cases, they will be blood work, cultures and biopsy results pending at the time of your discharge. Please request that your primary care M.D. follows up on these results.  Also Note the following: If you experience worsening of your admission symptoms, develop shortness of breath, life threatening emergency, suicidal or homicidal thoughts you must seek medical attention immediately by calling 911 or calling your MD immediately  if symptoms less severe.  You must read complete instructions/literature along with all the possible adverse reactions/side effects for all the Medicines you take and that have been prescribed to you. Take any new Medicines after you have completely understood and accpet all the possible adverse reactions/side effects.   Do not drive when taking Pain medications or  sleeping medications (Benzodaizepines)  Do not take more than prescribed Pain, Sleep and Anxiety Medications. It is not advisable to combine anxiety,sleep and pain medications without talking with your primary care practitioner  Special Instructions: If you have smoked or chewed Tobacco  in the last 2 yrs please stop smoking, stop any regular Alcohol  and or any Recreational drug use.  Wear Seat belts while driving.  Please note: You were cared for by a hospitalist during your hospital stay. Once you are discharged, your primary care physician will handle any further medical issues. Please note that NO REFILLS for any discharge medications will be authorized once you are discharged, as it is imperative that you return to your primary care physician (or establish a relationship with a  primary care physician if you do not have one) for your post hospital discharge needs so that they can reassess your need for medications and monitor your lab values.   Discharge wound care:   Complete by: As directed    keep the bandage clean and dry until follow-up with Dr. Amedeo Plenty.   Flush IV access with Sodium Chloride 0.9% and Heparin 10 units/ml or 100 units/ml   Complete by: As directed    Home infusion instructions - [facility]   Complete by: As directed    Instructions: Flush IV access with Sodium Chloride 0.9% and Heparin 10units/ml or 100units/ml   Change dressing on IV access line: Weekly and PRN   Instructions Cath Flo 44m: Administer for PICC Line occlusion and as ordered by physician for other access device   [facility] pharmacist to adjust dose for: Vancomycin, Aminoglycosides and other anti-infective therapies as requested by physician   Increase activity slowly   Complete by: As directed    Method of administration may be changed at the discretion of home infusion pharmacist based upon assessment of the patient and/or caregiver's ability to self-administer the medication ordered   Complete by: As directed      Allergies as of 08/16/2020   No Known Allergies     Medication List    STOP taking these medications   ibuprofen 200 MG tablet Commonly known as: ADVIL     TAKE these medications   acetaminophen 325 MG tablet Commonly known as: Tylenol Take 2 tablets (650 mg total) by mouth every 6 (six) hours as needed.   Oxycodone HCl 10 MG Tabs Take 1 tablet (10 mg total) by mouth every 6 (six) hours as needed for severe pain.   vancomycin  IVPB Inject 1,250 mg into the vein every 8 (eight) hours. Indication:  MRSA Osteomyelitis First Dose: Yes Last Day of Therapy:  09/24/20 Labs - _0 /15/21 1021   08/16/20 0000  Discharge wound care:       Comments: keep the bandage clean and dry until follow-up with Dr. GAmedeo Plenty   08/16/20  [place]. Go on 08/29/2020.   Why: 3:30 pm Contact information: [address] [place] [address] [phone]       [facility] Follow up.   Why: [facility] will be providing you with IV antibiotic coordination for home infusions.  Please call their office if needed - [phone].       Roseanne Kaufman, MD Follow up in 10 day(s).   Specialty: Orthopedic Surgery Why: Please call to see Dr. Amedeo Plenty in his office in approximately 10 days. Contact information: [address] [place] [address] [phone]        Dargan Callas, NP Follow up on 08/28/2020.   Specialty: Infectious Diseases Why: 10:45 am Contact information: [place] [address] [phone]              No Known Allergies    Other Procedures/Studies: DG Chest Port 1V same Day  Result Date: 08/12/2020 CLINICAL DATA:  Sob according to order, pt also confirms sob today. EXAM: PORTABLE CHEST 1 VIEW COMPARISON:  None. FINDINGS: Normal mediastinum and cardiac silhouette. Normal pulmonary vasculature. No evidence of effusion, infiltrate, or pneumothorax. No acute bony abnormality. IMPRESSION: No acute cardiopulmonary process. Electronically Signed   By: Suzy Bouchard M.D.   On: 08/12/2020 10:14   DG Hand Complete Left  Result Date: 08/11/2020 CLINICAL  DATA:  Finger fractures.  Infected left thumb. EXAM: LEFT HAND - COMPLETE 3+ VIEW COMPARISON:  04/28/2020. FINDINGS: Extensive soft tissue swelling noted about the left thumb. Erosive changes noted about the distal aspect of the proximal phalanx and proximal aspect of distal phalanx of the thumb. These findings suggest osteomyelitis and possible septic arthritis. Associated fracture of the base of the distal phalanx of the left thumb noted. Nonunited angulated comminuted fracture of the proximal phalanx of the left second digit noted. Tiny radiopaque foreign bodies in the soft tissues adjacent to the left thumb cannot be excluded. IMPRESSION: 1. Severe soft tissue swelling left thumb. Erosive changes noted about the distal aspect of the proximal phalanx and proximal aspect of the distal phalanx of the left thumb. These findings suggest osteomyelitis and possible septic arthritis. Associated fracture of the base of the distal phalanx of the left thumb noted. Tiny radiopaque foreign bodies in the soft tissues adjacent to the left thumb cannot be excluded. 2. Nonunited angulated comminuted fracture of the proximal phalanx of the left second digit noted Electronically Signed   By: [place]   On: 08/11/2020 09:02   DG Finger Thumb Left  Result Date: 08/11/2020 CLINICAL DATA:  Rule out foreign body. Patient presents with wound to left thumb for 1 week. Thumb is rib swollen, red and warm to touch. Reported splinter 8 days ago. EXAM: LEFT THUMB 2+V COMPARISON:  Hand radiographs 04/28/2020 FINDINGS: Marked soft tissue swelling of the thumb. There is a minimally displaced intra-articular fracture through the lateral base of the distal phalanx. There is surrounding lucency of the distal phalanx and the adjacent head of the proximal phalanx with associated erosive change along the volar aspect, best seen on the lateral. Punctate linear densities in the soft tissues of the volar and dorsal aspect of the thumb.  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 IMPRESSION: 1. Acute mildly displaced intra-articular fracture through the lateral base of the distal phalanx of the thumb. 2. Surrounding osseous  lucency of the distal phalanx of the thumb and erosive change involving the adjacent head of the proximal phalanx, which is compatible with osteomyelitis. 3. Surrounding soft tissue swelling, compatible with cellulitis. Punctate linear densities along the volar and dorsal soft tissues of the thumb, which may represent small soft tissue foreign bodies or areas of mineralization. 4. 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 Dedicated hand radiographs could further evaluate if clinically indicated. Electronically Signed   By: Margaretha Sheffield MD   On: 08/11/2020 08:28   [place] EKG SITE RITE  Result Date: 08/13/2020 If Site Rite image not attached, placement could not be confirmed due to current cardiac rhythm.    TODAY-DAY OF DISCHARGE:  Subjective:   Kenneth Faulkner today has no headache,no chest abdominal pain,no new weakness tingling or numbness, feels much better wants to go home today.   Objective:   Blood pressure (!) 157/74, pulse 72, temperature 98.8 F (37.1 C), temperature source Oral, resp. rate 20, height _0  (1.702 m), weight 77.4 kg, SpO2 96 %.  Intake/Output Summary (Last 24 hours) at 08/16/2020 1024 Last data filed at 08/16/2020 0600 Gross per 24 hour  Intake 928.41 ml  Output 850 ml  Net 78.41 ml   Filed Weights   08/11/20 0800 08/11/20 1651 08/12/20 0000  Weight: 81.6 kg 73.5 kg 77.4 kg    Exam: Awake Alert, Oriented *3, No new F.N deficits, Normal affect [place].AT,PERRAL Supple Neck,No JVD, No cervical lymphadenopathy appriciated.  Symmetrical Chest wall movement, Good air movement bilaterally, CTAB RRR,No Gallops,Rubs or new Murmurs, No Parasternal Heave +ve B.Sounds, Abd  Soft, Non tender, No organomegaly appriciated, No rebound -guarding or rigidity. No Cyanosis, Clubbing or edema, No new Rash or bruise   PERTINENT RADIOLOGIC STUDIES: DG Chest Port 1V same Day  Result Date: 08/12/2020 CLINICAL DATA:  Sob according to order, pt also confirms sob today. EXAM: PORTABLE CHEST 1 VIEW COMPARISON:  None. FINDINGS: Normal mediastinum and cardiac silhouette. Normal pulmonary vasculature. No evidence of effusion, infiltrate, or pneumothorax. No acute bony abnormality. IMPRESSION: No acute cardiopulmonary process. Electronically Signed   By: Suzy Bouchard M.D.   On: 08/12/2020 10:14   DG Hand Complete Left  Result Date: 08/11/2020 CLINICAL DATA:  Finger fractures.  Infected left thumb. EXAM: LEFT HAND - COMPLETE 3+ VIEW COMPARISON:  04/28/2020. FINDINGS: Extensive soft tissue swelling noted about the left thumb. Erosive changes noted about the distal aspect of the proximal phalanx and proximal aspect of distal phalanx of the thumb. These findings suggest osteomyelitis and possible septic arthritis. Associated fracture of the base of the distal phalanx of the left thumb noted. Nonunited angulated comminuted fracture of the proximal phalanx of the left second digit noted. Tiny radiopaque foreign bodies in the soft tissues adjacent to the left thumb cannot be excluded. IMPRESSION: 1. Severe soft tissue swelling left thumb. Erosive changes noted about the distal aspect of the proximal phalanx and proximal aspect of the distal phalanx of the left thumb. These findings suggest osteomyelitis and possible septic arthritis. Associated fracture of the base of the distal phalanx of the left thumb noted. Tiny radiopaque foreign bodies in the soft tissues adjacent to the left thumb cannot be excluded. 2. Nonunited angulated comminuted fracture of the proximal phalanx of the left second digit noted Electronically Signed   By: [place]   On: 08/11/2020 09:02   DG Finger Thumb  Left  Result Date: 08/11/2020 CLINICAL DATA:  Rule out foreign body. Patient presents  with wound to left thumb for 1 week. Thumb is rib swollen, red and warm to touch. Reported splinter 8 days ago. EXAM: LEFT THUMB 2+V COMPARISON:  Hand radiographs 04/28/2020 FINDINGS: Marked soft tissue swelling of the thumb. There is a minimally displaced intra-articular fracture through the lateral base of the distal phalanx. There is surrounding lucency of the distal phalanx and the adjacent head of the proximal phalanx with associated erosive change along the volar aspect, best seen on the lateral. Punctate linear densities in the soft tissues of the volar and dorsal aspect of the thumb. 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 IMPRESSION: 1. Acute mildly displaced intra-articular fracture through the lateral base of the distal phalanx of the thumb. 2. Surrounding osseous lucency of the distal phalanx of the thumb and erosive change involving the adjacent head of the proximal phalanx, which is compatible with osteomyelitis. 3. Surrounding soft tissue swelling, compatible with cellulitis. Punctate linear densities along the volar and dorsal soft tissues of the thumb, which may represent small soft tissue foreign bodies or areas of mineralization. 4. 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 Dedicated hand radiographs could further evaluate if clinically indicated. Electronically Signed   By: Margaretha Sheffield MD   On: 08/11/2020 08:28   [place] EKG SITE RITE  Result Date: 08/13/2020 If Site Rite image not attached, placement could not be confirmed due to current cardiac rhythm.    PERTINENT LAB RESULTS: CBC: No results for input(s): WBC, HGB, HCT, PLT in the last 72 hours. CMET CMP     Component Value Date/Time   NA 139 08/16/2020 0502   K 4.8 08/16/2020 0502   CL 100 08/16/2020 0502   CO2 31 08/16/2020  0502   GLUCOSE 97 08/16/2020 0502   BUN 11 08/16/2020 0502   CREATININE 0.89 08/16/2020 0502   CALCIUM 9.2 08/16/2020 0502   PROT 6.0 (L) 08/12/2020 0250   ALBUMIN 3.0 (L) 08/12/2020 0250   AST 18 08/12/2020 0250   ALT 15 08/12/2020 0250   ALKPHOS 78 08/12/2020 0250   BILITOT 0.5 08/12/2020 0250   GFRNONAA >60 08/16/2020 0502   GFRAA >60 08/16/2020 0502    GFR Estimated Creatinine Clearance: 97 mL/min (by C-G formula based on SCr of 0.89 mg/dL). No results for input(s): LIPASE, AMYLASE in the last 72 hours. No results for input(s): CKTOTAL, CKMB, CKMBINDEX, TROPONINI in the last 72 hours. Invalid input(s): POCBNP No results for input(s): DDIMER in the last 72 hours. No results for input(s): HGBA1C in the last 72 hours. No results for input(s): CHOL, HDL, LDLCALC, TRIG, CHOLHDL, LDLDIRECT in the last 72 hours. No results for input(s): TSH, T4TOTAL, T3FREE, THYROIDAB in the last 72 hours.  Invalid input(s): FREET3 No results for input(s): VITAMINB12, FOLATE, FERRITIN, TIBC, IRON, RETICCTPCT in the last 72 hours. Coags: No results for input(s): INR in the last 72 hours.  Invalid input(s): PT Microbiology: Recent Results (from the past 240 hour(s))  SARS Coronavirus 2 by RT PCR (hospital order, performed in [facility] hospital lab) Nasopharyngeal Nasopharyngeal Swab     Status: Abnormal   Collection Time: 08/11/20  7:48 AM   Specimen: Nasopharyngeal Swab  Result Value Ref Range Status   SARS Coronavirus 2 POSITIVE (A) NEGATIVE Final    Comment: emailed L. Berdik RN 12:10 08/11/20 ([email or web address]) (NOTE) SARS-CoV-2 target nucleic acids are DETECTED  SARS-CoV-2 RNA is generally detectable in upper respiratory specimens  during the acute phase  of infection.  Positive results are indicative  of the presence of the identified virus, but do not rule out bacterial infection or co-infection with other pathogens not detected by the test.  Clinical correlation with patient history and   other diagnostic information is necessary to determine patient infection status.  The expected result is negative.  Fact Sheet for Patients:   StrictlyIdeas.no   Fact Sheet for Healthcare Providers:   BankingDealers.co.za    This test is not yet approved or cleared by the [place] FDA and  has been authorized for detection and/or diagnosis of SARS-CoV-2 by FDA under an Emergency Use Authorization (EUA).  This EUA will remain in effect (meaning this test can be used) for the duration of  the  COVID-19 declaration under Section 564(b)(1) of the Act, 21 U.S.C. section 360-bbb-3(b)(1), unless the authorization is terminated or revoked sooner.  Performed at [facility], [place] [address], [place], [place] [address]   Aerobic/Anaerobic Culture (surgical/deep wound)     Status: None (Preliminary result)   Collection Time: 08/11/20  7:31 PM   Specimen: PATH Soft tissue  Result Value Ref Range Status   Specimen Description ABSCESS  Final   Special Requests SOFT TISSUE SAMPLE A  Final   Gram Stain   Final    ABUNDANT WBC PRESENT, PREDOMINANTLY PMN MODERATE GRAM POSITIVE COCCI Performed at [facility], [address] [place], [place] [address]    Culture   Final    FEW METHICILLIN RESISTANT STAPHYLOCOCCUS AUREUS MODERATE GROUP A STREP (S.PYOGENES) ISOLATED Beta hemolytic streptococci are predictably susceptible to penicillin and other beta lactams. Susceptibility testing not routinely performed. NO ANAEROBES ISOLATED; CULTURE IN PROGRESS FOR 5 DAYS    Report Status PENDING  Incomplete   Organism ID, Bacteria METHICILLIN RESISTANT STAPHYLOCOCCUS AUREUS  Final      Susceptibility   Methicillin resistant staphylococcus aureus - MIC*    CIPROFLOXACIN >=8 RESISTANT Resistant     ERYTHROMYCIN >=8 RESISTANT Resistant     GENTAMICIN <=0.5 SENSITIVE Sensitive     OXACILLIN >=4 RESISTANT Resistant     TETRACYCLINE <=1 SENSITIVE  Sensitive     VANCOMYCIN <=0.5 SENSITIVE Sensitive     TRIMETH/SULFA >=320 RESISTANT Resistant     CLINDAMYCIN >=8 RESISTANT Resistant     RIFAMPIN <=0.5 SENSITIVE Sensitive     Inducible Clindamycin NEGATIVE Sensitive     * FEW METHICILLIN RESISTANT STAPHYLOCOCCUS AUREUS  Aerobic/Anaerobic Culture (surgical/deep wound)     Status: None (Preliminary result)   Collection Time: 08/11/20  7:34 PM   Specimen: Bone; Tissue  Result Value Ref Range Status   Specimen Description ABSCESS  Final   Special Requests BONE SAMPLE B  Final   Gram Stain   Final    RARE WBC PRESENT, PREDOMINANTLY PMN NO ORGANISMS SEEN Performed at [facility], [address] [place], [place] [address]    Culture   Final    RARE METHICILLIN RESISTANT STAPHYLOCOCCUS AUREUS NO ANAEROBES ISOLATED; CULTURE IN PROGRESS FOR 5 DAYS    Report Status PENDING  Incomplete   Organism ID, Bacteria METHICILLIN RESISTANT STAPHYLOCOCCUS AUREUS  Final      Susceptibility   Methicillin resistant staphylococcus aureus - MIC*    CIPROFLOXACIN >=8 RESISTANT Resistant     ERYTHROMYCIN >=8 RESISTANT Resistant     GENTAMICIN <=0.5 SENSITIVE Sensitive     OXACILLIN >=4 RESISTANT Resistant     TETRACYCLINE <=1 SENSITIVE Sensitive     VANCOMYCIN <=0.5 SENSITIVE Sensitive  TRIMETH/SULFA >=320 RESISTANT Resistant     CLINDAMYCIN >=8 RESISTANT Resistant     RIFAMPIN <=0.5 SENSITIVE Sensitive     Inducible Clindamycin NEGATIVE Sensitive     * RARE METHICILLIN RESISTANT STAPHYLOCOCCUS AUREUS    FURTHER DISCHARGE INSTRUCTIONS:  Get Medicines reviewed and adjusted: Please take all your medications with you for your next visit with your Primary MD  Laboratory/radiological data: Please request your Primary MD to go over all hospital tests and procedure/radiological results at the follow up, please ask your Primary MD to get all Hospital records sent to his/her office.  In some cases, they will be blood work, cultures and biopsy  results pending at the time of your discharge. Please request that your primary care M.D. goes through all the records of your hospital data and follows up on these results.  Also Note the following: If you experience worsening of your admission symptoms, develop shortness of breath, life threatening emergency, suicidal or homicidal thoughts you must seek medical attention immediately by calling 911 or calling your MD immediately  if symptoms less severe.  You must read complete instructions/literature along with all the possible adverse reactions/side effects for all the Medicines you take and that have been prescribed to you. Take any new Medicines after you have completely understood and accpet all the possible adverse reactions/side effects.   Do not drive when taking Pain medications or sleeping medications (Benzodaizepines)  Do not take more than prescribed Pain, Sleep and Anxiety Medications. It is not advisable to combine anxiety,sleep and pain medications without talking with your primary care practitioner  Special Instructions: If you have smoked or chewed Tobacco  in the last 2 yrs please stop smoking, stop any regular Alcohol  and or any Recreational drug use.  Wear Seat belts while driving.  Please note: You were cared for by a hospitalist during your hospital stay. Once you are discharged, your primary care physician will handle any further medical issues. Please note that NO REFILLS for any discharge medications will be authorized once you are discharged, as it is imperative that you return to your primary care physician (or establish a relationship with a primary care physician if you do not have one) for your post hospital discharge needs so that they can reassess your need for medications and monitor your lab values.  Total Time spent coordinating discharge including counseling, education and face to face time equals 35 minutes.  SignedOren Binet 08/16/2020 10:24 AM

## 2020-08-17 ENCOUNTER — Telehealth: Payer: Self-pay

## 2020-08-17 LAB — AEROBIC/ANAEROBIC CULTURE W GRAM STAIN (SURGICAL/DEEP WOUND)

## 2020-08-17 NOTE — Telephone Encounter (Signed)
Received call in triage from [facility] nursing requesting orders. Explained to Rn that patient has not currently been seen in the office yet and going to provide the RN with on-call provider number. RN states "yall never see our patient before we needs orders" and disconnected the call.  Call placed to [facility] to make aware of call. [facility] will follow up with [facility] and make the team aware of the process.  Valarie Cones

## 2020-08-23 ENCOUNTER — Telehealth: Payer: Self-pay | Admitting: *Deleted

## 2020-08-23 NOTE — Telephone Encounter (Signed)
Relayed information to [name] at [facility].  [name] is supposed to come there this week for surgical dressing change. There are no appointments on file for him, he has not reached out to speak with anyone at [facility].  Spoke with [employer].  She states Kenneth Faulkner left her house last night, will not be back. She is not sure if he took his vancomycin with him.  She said he does not have a phone number right now, but she can text a message to the other woman he is staying with.  RN asked Kenneth Faulkner to have Kenneth Faulkner call both [facility] and [facility] office. Per Kenneth Faulkner does have (and is aware of) an appointment somewhere 9/23. This is not in the [facility] system. Updated Kenneth Faulkner at [facility].  Per Kenneth Faulkner, If he cannot comply with our ability to get in touch with him, meet with home health and take his IV antibiotics then yes we can will need to pull it and I can start him on linezolid x 4 weeks  Kenneth Coss, RN
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
## 2020-08-25 ENCOUNTER — Telehealth: Payer: Self-pay | Admitting: *Deleted

## 2020-08-25 NOTE — Telephone Encounter (Signed)
Thanks for the update

## 2020-08-25 NOTE — Telephone Encounter (Signed)
[name]'s phone is out of service.  Per Park Pope left her home Sunday night. She thinks he is staying with his sister, who is currently staying short term with a friend/roommate.  No one other than Herbert Seta has a working Clinical cytogeneticist.  She will message the sister/roommate and have them call [phone].  Per Judeth Cornfield, patient needs to have his PICC line pulled out. He was discharged 9/15 with vancomycin, but has not had labs or PICC dressing change since discharge, as nursing has been unable to find him.  Per Larita Fife at [facility], even if patient were to contact him or nursing, nursing will not go out to pull the PICC.  They have discharged him due to feeling it is an unsafe environment.  Patient has not been in touch with surgeon, did not go there this week as directed to have his surgical bandage removed.  If patient contacts [facility] as requested, he will need his PICC pulled and start oral linezolid per Judeth Cornfield (please provide prescription).  Patient is uninsured, will need patient assistance for this medication. Andree Coss, RN

## 2020-08-28 ENCOUNTER — Telehealth: Payer: Self-pay | Admitting: Infectious Diseases

## 2020-08-28 ENCOUNTER — Other Ambulatory Visit: Payer: Self-pay

## 2020-08-28 NOTE — Telephone Encounter (Signed)
Patient no-showed his visit with me.  If we can get a call to Herbert Seta (his girlfriend or ex girlfriend) to see if we can arrange the police to do a well-check and try to get him here to get that PICC line out I think that is all we can do.

## 2020-08-28 NOTE — Telephone Encounter (Signed)
RN attempted to call patient - phone still out of service. RN called ex girlfriend Kenneth Faulkner - call went to voicemail. RN left message asking Kenneth Faulkner to please have Kenneth Faulkner contact [facility] or go to the ER to have his PICC removed, as it has been without care since his discharge on 9/15. No one has been able to get in touch with patient (home health nurse, home health pharmacy, Infectious disease or surgeon).  RN advised that if we do not hear from Kenneth Faulkner or from Kenneth Faulkner the next step is to call the police for a well-check and have him escorted to the ER to have it pulled.  The line has been in without care and is a risk to his health.

## 2020-08-29 ENCOUNTER — Telehealth: Payer: Self-pay | Admitting: *Deleted

## 2020-08-29 ENCOUNTER — Inpatient Hospital Stay: Payer: Self-pay

## 2020-08-29 NOTE — Telephone Encounter (Signed)
Patient has not contacted [facility] or surgeon's office.  Discharged 08/16/20 with PICC and IV vancomycin, never followed up with home health agency.  Patient's phone not in service.  Emergency contact is Herbert Seta with whom he was staying. Per Herbert Seta, patient left her home 08/20/20, allegedly staying with his sister.  Per Herbert Seta, there are no other phone numbers, but she can get in touch with patient/sister by messaging them. RN left message 08/28/20 for Herbert Seta stating we needed to hear from her or patient, otherwise would call police department for welfare check. No call back per chart. Patient did not keep appointment at [facility] 08/28/20.  RN requested Welfare check for patient by [employer] 08/29/20 [phone]). Gave report call back number of [phone] Andree Coss, RN

## 2020-08-30 NOTE — Telephone Encounter (Signed)
RN spoke with Air cabin crew.  He is familiar with the patient, has been trying to find him for 5 months due to outstanding warrants. Patient was not at the address on file, but Officer Brown left message with the person there (not Herbert Seta, whom he also knows) advising Wes to contact [facility], his surgeon, or go to the ER.

## 2020-09-19 ENCOUNTER — Telehealth: Payer: Self-pay | Admitting: Infectious Diseases

## 2020-09-30 ENCOUNTER — Emergency Department (HOSPITAL_COMMUNITY): Payer: Self-pay

## 2020-09-30 ENCOUNTER — Other Ambulatory Visit: Payer: Self-pay

## 2020-09-30 ENCOUNTER — Emergency Department (HOSPITAL_COMMUNITY)
Admission: EM | Admit: 2020-09-30 | Discharge: 2020-09-30 | Disposition: A | Discharge status: Home / Self Care | Payer: Self-pay | Attending: Emergency Medicine | Admitting: Emergency Medicine

## 2020-09-30 DIAGNOSIS — S82831A Other fracture of upper and lower end of right fibula, initial encounter for closed fracture: Secondary | ICD-10-CM

## 2020-09-30 DIAGNOSIS — Z23 Encounter for immunization: Secondary | ICD-10-CM | POA: Insufficient documentation

## 2020-09-30 DIAGNOSIS — F1910 Other psychoactive substance abuse, uncomplicated: Secondary | ICD-10-CM

## 2020-09-30 DIAGNOSIS — M25512 Pain in left shoulder: Secondary | ICD-10-CM | POA: Insufficient documentation

## 2020-09-30 DIAGNOSIS — M79661 Pain in right lower leg: Secondary | ICD-10-CM | POA: Insufficient documentation

## 2020-09-30 DIAGNOSIS — T1490XA Injury, unspecified, initial encounter: Secondary | ICD-10-CM

## 2020-09-30 DIAGNOSIS — R911 Solitary pulmonary nodule: Secondary | ICD-10-CM

## 2020-09-30 DIAGNOSIS — R0781 Pleurodynia: Secondary | ICD-10-CM | POA: Insufficient documentation

## 2020-09-30 DIAGNOSIS — Y92410 Unspecified street and highway as the place of occurrence of the external cause: Secondary | ICD-10-CM | POA: Insufficient documentation

## 2020-09-30 LAB — COMPREHENSIVE METABOLIC PANEL
ALT: 28 U/L (ref 0–44)
AST: 39 U/L (ref 15–41)
Albumin: 4.5 g/dL (ref 3.5–5.0)
Alkaline Phosphatase: 76 U/L (ref 38–126)
Anion gap: 14 (ref 5–15)
BUN: 20 mg/dL (ref 6–20)
CO2: 21 mmol/L — ABNORMAL LOW (ref 22–32)
Calcium: 9.7 mg/dL (ref 8.9–10.3)
Chloride: 102 mmol/L (ref 98–111)
Creatinine, Ser: 1.06 mg/dL (ref 0.61–1.24)
GFR, Estimated: >60 mL/min (ref >60)
Glucose, Bld: 118 mg/dL — ABNORMAL HIGH (ref 70–99)
Potassium: 3.9 mmol/L (ref 3.5–5.1)
Sodium: 137 mmol/L (ref 135–145)
Total Bilirubin: 1.1 mg/dL (ref 0.3–1.2)
Total Protein: 7.6 g/dL (ref 6.5–8.1)

## 2020-09-30 LAB — CBC
HCT: 45.4 % (ref 39.0–52.0)
Hemoglobin: 15.5 g/dL (ref 13.0–17.0)
MCH: 29.5 pg (ref 26.0–34.0)
MCHC: 34.1 g/dL (ref 30.0–36.0)
MCV: 86.3 fL (ref 80.0–100.0)
Platelets: 298 10*3/uL (ref 150–400)
RBC: 5.26 MIL/uL (ref 4.22–5.81)
RDW: 13.2 % (ref 11.5–15.5)
WBC: 19.9 10*3/uL — ABNORMAL HIGH (ref 4.0–10.5)
nRBC: 0 % (ref 0.0–0.2)

## 2020-09-30 LAB — I-STAT CHEM 8, ED
BUN: 21 mg/dL — ABNORMAL HIGH (ref 6–20)
Calcium, Ion: 1.1 mmol/L — ABNORMAL LOW (ref 1.15–1.40)
Chloride: 104 mmol/L (ref 98–111)
Creatinine, Ser: 0.8 mg/dL (ref 0.61–1.24)
Glucose, Bld: 118 mg/dL — ABNORMAL HIGH (ref 70–99)
HCT: 45 % (ref 39.0–52.0)
Hemoglobin: 15.3 g/dL (ref 13.0–17.0)
Potassium: 3.7 mmol/L (ref 3.5–5.1)
Sodium: 138 mmol/L (ref 135–145)
TCO2: 21 mmol/L — ABNORMAL LOW (ref 22–32)

## 2020-09-30 LAB — LACTIC ACID, PLASMA: Lactic Acid, Venous: 2.5 mmol/L (ref 0.5–1.9)

## 2020-09-30 LAB — PROTIME-INR
INR: 1 (ref 0.8–1.2)
Prothrombin Time: 12.5 seconds (ref 11.4–15.2)

## 2020-09-30 LAB — SAMPLE TO BLOOD BANK

## 2020-09-30 LAB — ETHANOL: Alcohol, Ethyl (B): <10 mg/dL (ref <10)

## 2020-09-30 MED ORDER — IBUPROFEN 600 MG PO TABS: 600.0000 mg | ORAL_TABLET | Freq: Four times a day (QID) | ORAL | 0 refills | Status: AC | PRN

## 2020-09-30 MED ORDER — HYDROCODONE-ACETAMINOPHEN 5-325 MG PO TABS
1.0000 | ORAL_TABLET | Freq: Once | ORAL | Status: AC
  Administered 2020-09-30: 1 via ORAL
  Filled 2020-09-30: qty 1

## 2020-09-30 MED ORDER — TETANUS-DIPHTH-ACELL PERTUSSIS 5-2.5-18.5 LF-MCG/0.5 IM SUSY
0.5000 mL | PREFILLED_SYRINGE | Freq: Once | INTRAMUSCULAR | Status: AC
  Administered 2020-09-30: 0.5 mL via INTRAMUSCULAR
  Filled 2020-09-30: qty 0.5

## 2020-09-30 MED ORDER — IOHEXOL 300 MG/ML  SOLN
100.0000 mL | Freq: Once | INTRAMUSCULAR | Status: AC | PRN
  Administered 2020-09-30: 100 mL via INTRAVENOUS

## 2020-09-30 MED ORDER — HYDROCODONE-ACETAMINOPHEN 5-325 MG PO TABS
1.0000 | ORAL_TABLET | ORAL | 0 refills | Status: AC | PRN
Start: 2020-09-30 — End: ?

## 2020-09-30 MED ORDER — LACTATED RINGERS IV BOLUS
1000.0000 mL | Freq: Once | INTRAVENOUS | Status: AC
  Administered 2020-09-30: 1000 mL via INTRAVENOUS

## 2020-09-30 MED ORDER — FENTANYL CITRATE (PF) 100 MCG/2ML IJ SOLN
50.0000 ug | INTRAMUSCULAR | Status: DC | PRN
  Administered 2020-09-30: 50 ug via INTRAVENOUS
  Filled 2020-09-30: qty 2

## 2020-09-30 MED ORDER — FENTANYL CITRATE (PF) 100 MCG/2ML IJ SOLN
50.0000 ug | Freq: Once | INTRAMUSCULAR | Status: AC
  Administered 2020-09-30: 50 ug via INTRAVENOUS
  Filled 2020-09-30: qty 2

## 2020-09-30 NOTE — ED Notes (Signed)
Pt fitted with device by Ortho. Pt assisted up on crutches. 2 person stand by assist for safety. Pt hollered continually in pain while attempting to learn how to move around using crutches. Pt successfully ambulated around bed.
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
## 2020-09-30 NOTE — Discharge Instructions (Signed)
You have a pulmonary nodule in your lung.  There has been no significant change since your last CT 6 months ago. The radiologists recommend a repeat CT scan in 12 months to ensure stability of these nodules.  Your primary care doctor will need to order this test.  I recommend you try to establish care with a PCP as soon as you can.

## 2020-09-30 NOTE — ED Notes (Signed)
Pt transported to CT ?

## 2020-09-30 NOTE — ED Notes (Signed)
Pt almost tearful stating "my leg hurts so bad, oh god help me"

## 2020-09-30 NOTE — ED Notes (Signed)
Knife provided to security.

## 2020-09-30 NOTE — ED Notes (Signed)
Dr Particia Nearing aware of elevated lactic 2.5

## 2020-09-30 NOTE — ED Notes (Signed)
Attempted to reposition pt in bed per pt request. Pt hollering out in pain continuously. Asked to splint to be removed. This RN explained we will leave it on until ortho arrives to place new device. Pt verbalized understanding.  Paged Ortho to fit for knee immobilizer and crutches.

## 2020-09-30 NOTE — Progress Notes (Signed)
Orthopedic Tech Progress Note Patient Details:  Kenneth Faulkner 06-17-1976 [id number]  Ortho Devices Type of Ortho Device: Knee Immobilizer, Crutches Ortho Device/Splint Location: RLE Ortho Device/Splint Interventions: Application, Ordered   Post Interventions Patient Tolerated: Fair Instructions Provided: Adjustment of device   [name] [name] 09/30/2020, 10:25 AM

## 2020-09-30 NOTE — ED Notes (Signed)
Spoke to wife and provided update. Wife will arrange ride upon discharge.  Please call Heather at [phone]
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
## 2020-09-30 NOTE — Progress Notes (Signed)
Orthopedic Tech Progress Note Patient Details:  [name] [birth year] [id number] Level 2 Trauma  Patient ID: [name], male   DOB: October 13, 1976, 44 y.o.   MRN: [id number]   Smitty Pluck 09/30/2020, 6:46 AM

## 2020-09-30 NOTE — ED Notes (Signed)
Pt back from CT

## 2020-09-30 NOTE — ED Provider Notes (Signed)
Pt signed out by Dr. Erin Hearing pending Xrays and CT scans.  R tib fib:  IMPRESSION:  Comminuted mildly displaced fracture of the proximal fibula as  above.     Pt placed in a knee immobilizer and given crutches.  He will be given Dr. Luvenia Starch office number for follow up.  CT chest/abd/pelvis:   IMPRESSION:  1. Focal subcutaneous edema in the left posterior lower back region.  Otherwise, no acute traumatic injuries in the chest, abdomen or  pelvis.  2. Again noted are scattered small nodular densities in lungs. These  nodules are predominantly along the pleural and fissural surfaces.  Largest nodular density is along the right major fissure measuring  at least 6 mm and minimally changed. New sub solid density in the  superior segment of the left lower lobe that measures up to 5 mm and  could be related to focal atelectasis or inflammatory. Largest  nodular densities have not significantly changed in 6 months.  Therefore, recommend follow-up chest CT in 12 months to ensure  stability. This recommendation follows the consensus statement:  Guidelines for Management of Incidental Pulmonary Nodules Detected  on CT Images: From the Fleischner Society 2017; Radiology 2017;  [id number].   Pt told to follow up repeat CT scan in 12 months for pulmonary nodule.  CT head/c-spine:  IMPRESSION:  HEAD CT    1. Normal.    CERVICAL CT    1. Normal.     Pt given a resource guide for outpatient rehab options for his polysubstance abuse.  Pt is able to ambulate with assistance.  He said everything hurts.  He is d/c home with instructions to f/u with ortho/pcp and to return if worse.   Jacalyn Lefevre, MD 09/30/20 1034

## 2021-01-18 ENCOUNTER — Encounter (HOSPITAL_COMMUNITY): Payer: Self-pay | Admitting: Emergency Medicine

## 2021-01-18 ENCOUNTER — Emergency Department (HOSPITAL_COMMUNITY): Payer: Self-pay

## 2021-01-18 ENCOUNTER — Other Ambulatory Visit: Payer: Self-pay

## 2021-01-18 ENCOUNTER — Emergency Department (HOSPITAL_COMMUNITY)
Admission: EM | Admit: 2021-01-18 | Discharge: 2021-01-18 | Disposition: A | Discharge status: Home / Self Care | Payer: Self-pay | Attending: Emergency Medicine | Admitting: Emergency Medicine

## 2021-01-18 DIAGNOSIS — S61012A Laceration without foreign body of left thumb without damage to nail, initial encounter: Secondary | ICD-10-CM | POA: Insufficient documentation

## 2021-01-18 DIAGNOSIS — M79674 Pain in right toe(s): Secondary | ICD-10-CM | POA: Insufficient documentation

## 2021-01-18 DIAGNOSIS — Z8616 Personal history of COVID-19: Secondary | ICD-10-CM | POA: Insufficient documentation

## 2021-01-18 DIAGNOSIS — R519 Headache, unspecified: Secondary | ICD-10-CM | POA: Insufficient documentation

## 2021-01-18 DIAGNOSIS — F17200 Nicotine dependence, unspecified, uncomplicated: Secondary | ICD-10-CM | POA: Insufficient documentation

## 2021-01-18 DIAGNOSIS — Z8739 Personal history of other diseases of the musculoskeletal system and connective tissue: Secondary | ICD-10-CM | POA: Insufficient documentation

## 2021-01-18 MED ORDER — DOXYCYCLINE HYCLATE 100 MG PO CAPS: 100.0000 mg | ORAL_CAPSULE | Freq: Two times a day (BID) | ORAL | 0 refills | Status: AC

## 2021-01-18 MED ORDER — ACETAMINOPHEN 500 MG PO TABS
1000.0000 mg | ORAL_TABLET | Freq: Once | ORAL | Status: AC
  Administered 2021-01-18: 1000 mg via ORAL
  Filled 2021-01-18: qty 2
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
## 2021-01-18 NOTE — Discharge Instructions (Signed)
Your workup was reassuring at this time. Given your laceration was greater than 16 hours old it was not sutured at this time. Keep wound covered and it will heal by secondary intent.   Please take antibiotics as prescribed to cover for an infection.   Follow up with Dr. Amanda Pea regarding your thumb pain
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
## 2021-04-07 ENCOUNTER — Emergency Department (HOSPITAL_COMMUNITY): Payer: Self-pay

## 2021-04-07 ENCOUNTER — Emergency Department (HOSPITAL_COMMUNITY)
Admission: EM | Admit: 2021-04-07 | Discharge: 2021-04-07 | Payer: Self-pay | Attending: Emergency Medicine | Admitting: Emergency Medicine

## 2021-04-07 ENCOUNTER — Encounter (HOSPITAL_COMMUNITY): Payer: Self-pay

## 2021-04-07 DIAGNOSIS — T148XXA Other injury of unspecified body region, initial encounter: Secondary | ICD-10-CM

## 2021-04-07 DIAGNOSIS — Z8616 Personal history of COVID-19: Secondary | ICD-10-CM | POA: Insufficient documentation

## 2021-04-07 DIAGNOSIS — S51811A Laceration without foreign body of right forearm, initial encounter: Secondary | ICD-10-CM | POA: Insufficient documentation

## 2021-04-07 DIAGNOSIS — F17200 Nicotine dependence, unspecified, uncomplicated: Secondary | ICD-10-CM | POA: Insufficient documentation

## 2021-04-07 MED ORDER — CEPHALEXIN 500 MG PO CAPS: 500.0000 mg | ORAL_CAPSULE | Freq: Four times a day (QID) | ORAL | 0 refills | Status: AC

## 2021-04-07 MED ORDER — LIDOCAINE-EPINEPHRINE 1 %-1:100000 IJ SOLN
20.0000 mL | Freq: Once | INTRAMUSCULAR | Status: AC
  Administered 2021-04-07: 20 mL via INTRADERMAL
  Filled 2021-04-07: qty 1
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
## 2022-04-29 IMAGING — CR DG TOE 5TH 2+V*R*
3 series · 3 of 3 positions shown · non-contrast
Comparison: None.

CLINICAL DATA: Fifth toe pain.

EXAM:
RIGHT FIFTH TOE

[toe ap]
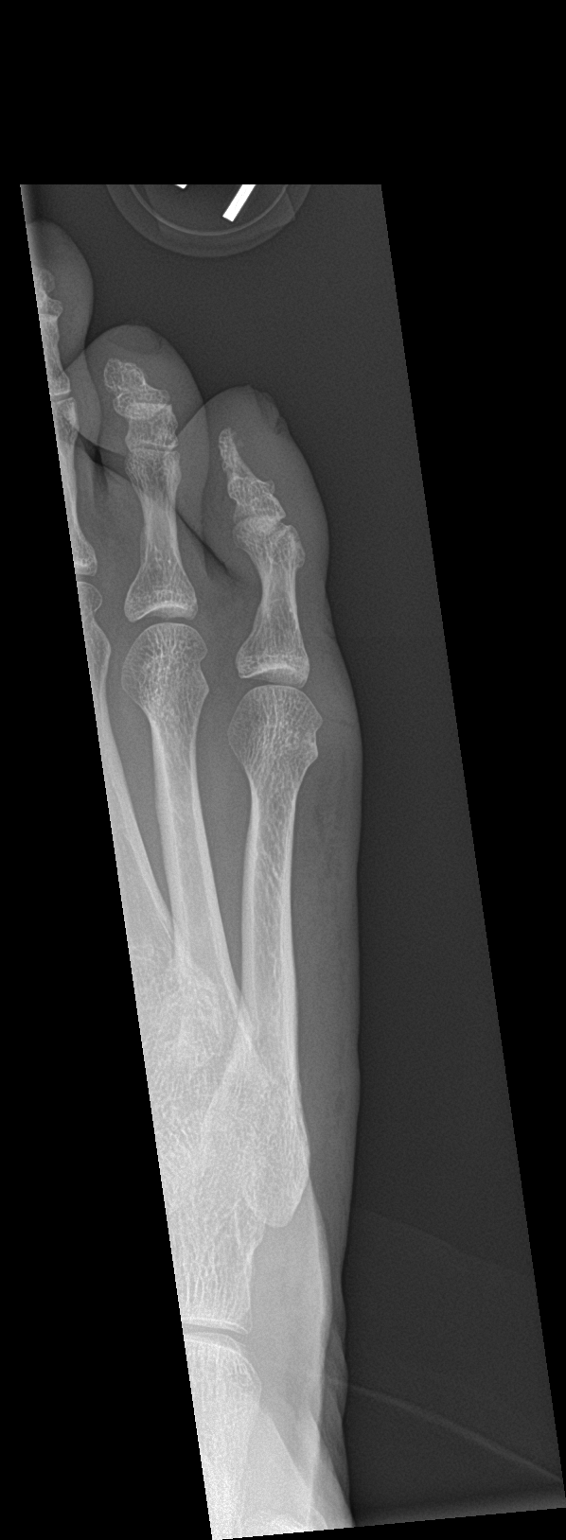

[toe obl]
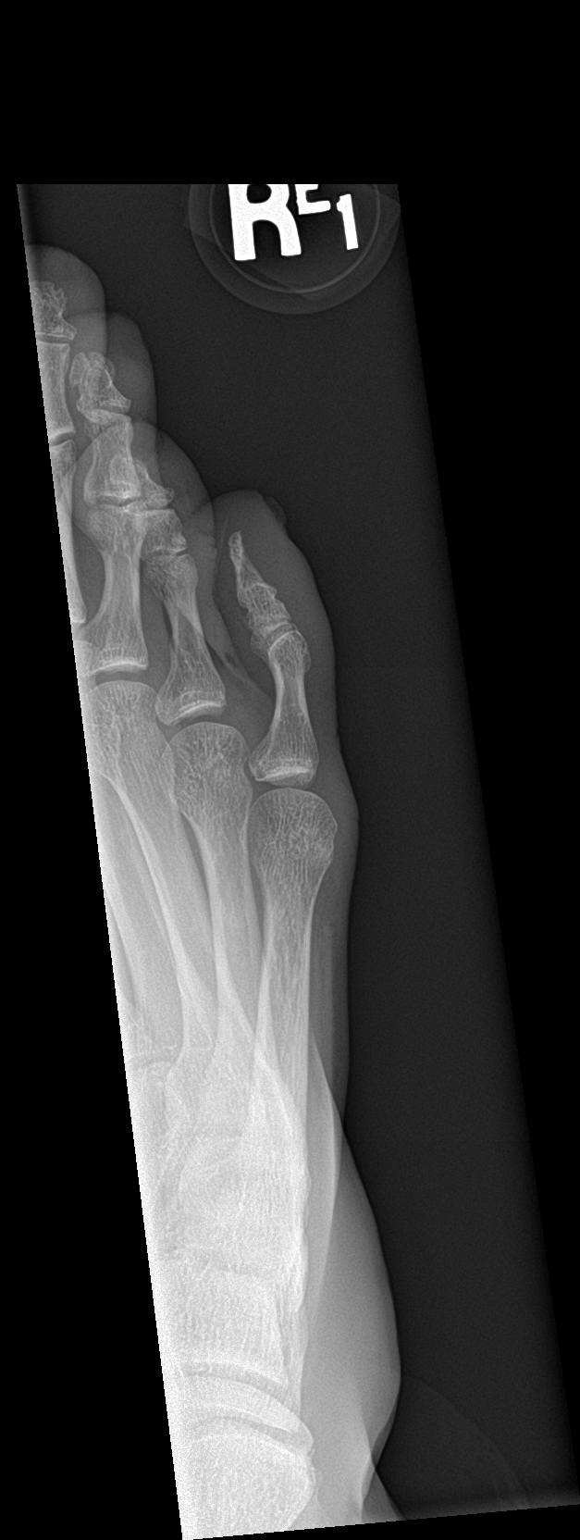

[toe lat]
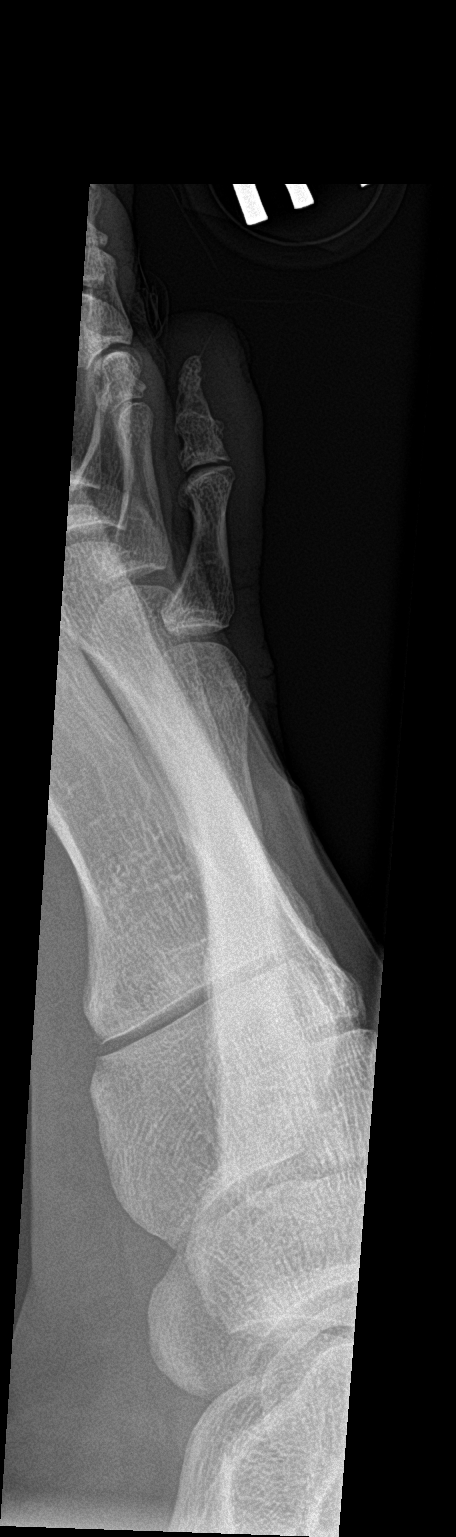

[3 of 3 positions shown; findings below may reference images not displayed]

FINDINGS: There is no evidence of fracture or dislocation. Distal and middle
phalanges are fused, normal variant. There is no evidence of
arthropathy or other focal bone abnormality. Soft tissues are
unremarkable.
IMPRESSION: Negative radiographs of the right fifth toe.
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
## 2024-05-22 ENCOUNTER — Other Ambulatory Visit: Payer: Self-pay

## 2024-05-22 ENCOUNTER — Emergency Department (HOSPITAL_COMMUNITY): Payer: Self-pay

## 2024-05-22 ENCOUNTER — Emergency Department (HOSPITAL_COMMUNITY)
Admission: EM | Admit: 2024-05-22 | Discharge: 2024-05-22 | Disposition: A | Discharge status: Home / Self Care | Payer: Self-pay | Attending: Emergency Medicine | Admitting: Emergency Medicine

## 2024-05-22 ENCOUNTER — Encounter (HOSPITAL_COMMUNITY): Payer: Self-pay

## 2024-05-22 DIAGNOSIS — S8991XA Unspecified injury of right lower leg, initial encounter: Secondary | ICD-10-CM

## 2024-05-22 DIAGNOSIS — S81811A Laceration without foreign body, right lower leg, initial encounter: Secondary | ICD-10-CM | POA: Insufficient documentation

## 2024-05-22 DIAGNOSIS — W228XXA Striking against or struck by other objects, initial encounter: Secondary | ICD-10-CM | POA: Insufficient documentation

## 2024-05-22 MED ORDER — LIDOCAINE-EPINEPHRINE (PF) 2 %-1:200000 IJ SOLN
INTRAMUSCULAR | Status: AC
  Administered 2024-05-22: 20 mL
  Filled 2024-05-22: qty 20

## 2024-05-22 MED ORDER — OXYCODONE-ACETAMINOPHEN 5-325 MG PO TABS
1.0000 | ORAL_TABLET | Freq: Once | ORAL | Status: AC
  Administered 2024-05-22: 1 via ORAL
  Filled 2024-05-22: qty 1

## 2024-05-22 MED ORDER — LIDOCAINE-EPINEPHRINE (PF) 2 %-1:200000 IJ SOLN: 10.0000 mL | Freq: Once | INTRAMUSCULAR | Status: AC
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
## 2024-05-22 NOTE — Discharge Instructions (Addendum)
 You had 5 staples placed to your right leg, you will need to have these removed within 7 to 10 days.  If you experience any redness, drainage, worsening pain you will need to return to the emergency department.
